# Patient Record
Sex: Female | Born: 1975 | Race: Asian | Hispanic: No | Marital: Married | Smoking: Never smoker
Health system: Southern US, Community
[De-identification: ages and names within clinical notes are randomized; demographics above are authoritative.]

## PROBLEM LIST (undated history)

## (undated) DIAGNOSIS — K219 Gastro-esophageal reflux disease without esophagitis: Secondary | ICD-10-CM

## (undated) HISTORY — PX: TONSILECTOMY, ADENOIDECTOMY, BILATERAL MYRINGOTOMY AND TUBES: SHX2538

## (undated) HISTORY — PX: RHINOPLASTY: SUR1284

## (undated) HISTORY — DX: Gastro-esophageal reflux disease without esophagitis: K21.9

---

## 2014-04-01 HISTORY — PX: BREAST BIOPSY: SHX20

## 2015-07-13 ENCOUNTER — Ambulatory Visit: Payer: Self-pay | Admitting: Family Medicine

## 2015-07-27 ENCOUNTER — Ambulatory Visit (INDEPENDENT_AMBULATORY_CARE_PROVIDER_SITE_OTHER): Payer: BLUE CROSS/BLUE SHIELD | Admitting: Family Medicine

## 2015-07-27 ENCOUNTER — Encounter: Payer: Self-pay | Admitting: Family Medicine

## 2015-07-27 VITALS — BP 100/60 | HR 81 | Temp 98.3°F | Ht 62.99 in | Wt 108.0 lb

## 2015-07-27 DIAGNOSIS — Z9289 Personal history of other medical treatment: Secondary | ICD-10-CM

## 2015-07-27 DIAGNOSIS — J029 Acute pharyngitis, unspecified: Secondary | ICD-10-CM | POA: Insufficient documentation

## 2015-07-27 DIAGNOSIS — A530 Latent syphilis, unspecified as early or late: Secondary | ICD-10-CM | POA: Insufficient documentation

## 2015-07-27 LAB — COMPLETE METABOLIC PANEL WITH GFR
ALT: 14 U/L (ref 6–29)
AST: 15 U/L (ref 10–30)
Albumin: 4.3 g/dL (ref 3.6–5.1)
Alkaline Phosphatase: 45 U/L (ref 33–115)
BILIRUBIN TOTAL: 0.7 mg/dL (ref 0.2–1.2)
BUN: 10 mg/dL (ref 7–25)
CO2: 27 mmol/L (ref 20–31)
CREATININE: 0.71 mg/dL (ref 0.50–1.10)
Calcium: 8.9 mg/dL (ref 8.6–10.2)
Chloride: 102 mmol/L (ref 98–110)
GFR, Est Non African American: >89 mL/min (ref >=60)
GLUCOSE: 87 mg/dL (ref 65–99)
Potassium: 4 mmol/L (ref 3.5–5.3)
SODIUM: 139 mmol/L (ref 135–146)
TOTAL PROTEIN: 6.9 g/dL (ref 6.1–8.1)

## 2015-07-27 LAB — CBC WITH DIFFERENTIAL/PLATELET
BASOS ABS: 0 {cells}/uL (ref 0–200)
Basophils Relative: 0 %
Eosinophils Absolute: 220 cells/uL (ref 15–500)
Eosinophils Relative: 4 %
HEMATOCRIT: 40.5 % (ref 35.0–45.0)
Hemoglobin: 13.9 g/dL (ref 11.7–15.5)
LYMPHS PCT: 29 %
Lymphs Abs: 1595 cells/uL (ref 850–3900)
MCH: 32 pg (ref 27.0–33.0)
MCHC: 34.3 g/dL (ref 32.0–36.0)
MCV: 93.1 fL (ref 80.0–100.0)
MONO ABS: 385 {cells}/uL (ref 200–950)
MPV: 10.7 fL (ref 7.5–12.5)
Monocytes Relative: 7 %
NEUTROS PCT: 60 %
Neutro Abs: 3300 cells/uL (ref 1500–7800)
Platelets: 245 10*3/uL (ref 140–400)
RBC: 4.35 MIL/uL (ref 3.80–5.10)
RDW: 12.5 % (ref 11.0–15.0)
WBC: 5.5 10*3/uL (ref 3.8–10.8)

## 2015-07-27 LAB — LIPID PANEL
Cholesterol: 190 mg/dL (ref 125–200)
HDL: 60 mg/dL (ref >=46)
LDL CALC: 115 mg/dL (ref <130)
TRIGLYCERIDES: 75 mg/dL (ref <150)
Total CHOL/HDL Ratio: 3.2 Ratio (ref <=5.0)
VLDL: 15 mg/dL (ref <30)

## 2015-07-27 LAB — TSH: TSH: 1.02 mIU/L

## 2015-07-27 NOTE — Progress Notes (Signed)
   Subjective:    Patient ID: Diamond Ramirez, female    DOB: 1975-12-22, 40 y.o.   MRN: [id number]  HPI 40 y/o [place] female presents for new patient visit. Video Interpretor present.  Reviewed New Patient Health History form and updated EPIC where appropriate. Reviewed PMH/PSH/Allergies/Med/Social History.  Reviewed vaccine record. Updated in EPIC.   Acute Concerns: 1. Sore Throat: ?tonsillitis in [place], pain worse with fried foods, saw ENT in [place] - told has issue with tonsils, told she needs them removed, currently has sore throat, has been treated with antibiotics for sore throat/?tonsillitis, no fevers, some cough, mild phlegm, no nasal congestion, no runny nose  2. History positive syphilis antibody - told while in [place] (treated with antibiotic in PCN in [place], documented history of PCN, see scanned document).   Social - no current health insurance, currently sexually active with husband (not interested in birth control at this time).    Review of Systems  Constitutional: Negative for fever and chills.  HENT: Positive for sore throat. Negative for postnasal drip and rhinorrhea.   Respiratory: Negative for cough and shortness of breath.   Cardiovascular: Negative for chest pain.  Gastrointestinal: Negative for nausea, vomiting and diarrhea.       Objective:   Physical Exam BP 100/60 mmHg  Pulse 81  Temp(Src) 98.3 F (36.8 C) (Oral)  Ht 5' 2.99" (1.6 m)  Wt 108 lb (48.988 kg)  BMI 19.14 kg/m2  SpO2 98%  LMP 07/26/2015  Gen: pleasant [place] female, NAD HEENT: normocephalic, PERRL, EOMI, no scleral icterus, bilateral TM's pearly grey, nasal septum midline, no rhinorrhea, MMM, uvula midline, no pharyngeal erythema or exudate, neck supple, no thyromegaly, no cervical adenopathy Cardiac: RRR, S1 and S2 present, no murmur, no heaves/thrills Resp: CTAB, normal effort Abd: soft, no tenderness, normal bowel sounds Ext: no edema, 2+ radial and DP  pulses       Assessment & Plan:  Sore throat Chronic sore throat. Exam unremarkable today. Previously told by ENT in [place] that she may need tonsils taken out. 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 -check RPR and HIV  Will also check basic labs today: CBC/CMP/Lipids/TSH

## 2015-07-27 NOTE — Patient Instructions (Addendum)
It was very nice to meet you.   Dr Randolm IdolFletke will call you with you lab results.   Chronic sore throat/?tonsillitis - you have referred to a ear,nose, and throat doctor. My office will call to schedule an appointment.   Please schedule a follow up appointment for you pap smear/well women exam.

## 2015-07-28 LAB — RPR TITER

## 2015-07-28 LAB — RPR: RPR Ser Ql: REACTIVE — AB

## 2015-07-28 LAB — HIV ANTIBODY (ROUTINE TESTING W REFLEX): HIV: NONREACTIVE

## 2015-07-28 NOTE — Assessment & Plan Note (Signed)
Chronic sore throat. Exam unremarkable today. Previously told by ENT in [place] that she may need tonsils taken out. -referral placed to ENT

## 2015-07-28 NOTE — Assessment & Plan Note (Signed)
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 Treated in [place]. -check RPR and HIV

## 2015-07-31 LAB — FLUORESCENT TREPONEMAL AB(FTA)-IGG-BLD: FLUORESCENT TREPONEMAL ABS: REACTIVE — AB

## 2015-08-01 ENCOUNTER — Encounter: Payer: Self-pay | Admitting: Family Medicine

## 2015-08-16 DIAGNOSIS — K219 Gastro-esophageal reflux disease without esophagitis: Secondary | ICD-10-CM | POA: Diagnosis not present

## 2015-08-16 DIAGNOSIS — J312 Chronic pharyngitis: Secondary | ICD-10-CM | POA: Diagnosis not present

## 2015-08-21 ENCOUNTER — Other Ambulatory Visit: Payer: Self-pay | Admitting: Family Medicine

## 2015-08-21 MED ORDER — OMEPRAZOLE 40 MG PO CPDR: 40.0000 mg | DELAYED_RELEASE_CAPSULE | Freq: Two times a day (BID) | ORAL | Status: DC

## 2015-08-21 NOTE — Progress Notes (Signed)
Started on Omeprazole 40 mg BID by ENT. Added to medication list.

## 2015-08-31 ENCOUNTER — Other Ambulatory Visit (HOSPITAL_COMMUNITY)
Admission: RE | Admit: 2015-08-31 | Discharge: 2015-08-31 | Disposition: A | Discharge status: Home / Self Care | Payer: BLUE CROSS/BLUE SHIELD | Source: Ambulatory Visit | Attending: Family Medicine | Admitting: Family Medicine

## 2015-08-31 ENCOUNTER — Ambulatory Visit (INDEPENDENT_AMBULATORY_CARE_PROVIDER_SITE_OTHER): Payer: BLUE CROSS/BLUE SHIELD | Admitting: Family Medicine

## 2015-08-31 ENCOUNTER — Encounter: Payer: Self-pay | Admitting: Family Medicine

## 2015-08-31 VITALS — BP 105/60 | HR 69 | Temp 98.3°F | Ht 62.0 in | Wt 114.0 lb

## 2015-08-31 DIAGNOSIS — N63 Unspecified lump in breast: Secondary | ICD-10-CM

## 2015-08-31 DIAGNOSIS — Z1151 Encounter for screening for human papillomavirus (HPV): Secondary | ICD-10-CM | POA: Insufficient documentation

## 2015-08-31 DIAGNOSIS — Z01419 Encounter for gynecological examination (general) (routine) without abnormal findings: Secondary | ICD-10-CM | POA: Diagnosis not present

## 2015-08-31 DIAGNOSIS — Z0000 Encounter for general adult medical examination without abnormal findings: Secondary | ICD-10-CM | POA: Diagnosis not present

## 2015-08-31 DIAGNOSIS — Z124 Encounter for screening for malignant neoplasm of cervix: Secondary | ICD-10-CM | POA: Diagnosis not present

## 2015-08-31 DIAGNOSIS — N6310 Unspecified lump in the right breast, unspecified quadrant: Secondary | ICD-10-CM | POA: Insufficient documentation

## 2015-08-31 NOTE — Patient Instructions (Signed)
It was nice to see you today.   You will be schedule for a mammogram of your right breast.  Dr. Randolm IdolFletke will call you with your Pap Smear results.   May follow up in one year for annual visit unless you have an acute issue.

## 2015-08-31 NOTE — Assessment & Plan Note (Signed)
Right mobile breast mass at 9 o'clock position -diagnostic mammogram ordered

## 2015-08-31 NOTE — Assessment & Plan Note (Signed)
40 y/o female presents for preventative visit. - up to date on immunizations - Pap smear performed today - Encouraged continued healthy diet and resular exercise - information on living will/poa provided

## 2015-08-31 NOTE — Progress Notes (Signed)
40 y.o. year old female presents for well woman/preventative visit and annual GYN examination.  Acute Concerns: None  Diet: Eats cooked meals at home but eats out occasionally. She says she does not eat much but does eat meat, vegetables, and fruits.   Exercise: She uses stationary bike and treadmill. She exercises at night every day if she gets home early enough. Started exercising regularly a month ago.   Sexual/Birth History: Sexually active with husband. Has two children. No complications with those births. One miscarriage. LMP was at the end of April.   Birth Control: None  POA/Living Will: None  Social:  Social History   Social History  . Marital Status: Married    Spouse Name: N/A  . Number of Children: N/A  . Years of Education: N/A   Social History Main Topics  . Smoking status: Never Smoker   . Smokeless tobacco: None  . Alcohol Use: No  . Drug Use: No  . Sexual Activity: Not Asked   Other Topics Concern  . None   Social History Narrative   Primary Language: [place]   Employment: [employer]   Education: [employer]   Married -    [phone] (vaginal deliveries, miscarriage was spontaneous)   Husbands name: Lucianne Lei lat truong   Immunization: Immunization History  Administered Date(s) Administered  . MMR 06/10/2014  . Tdap 06/10/2014  . Varicella 05/31/2010    Cancer Screening:  Pap Smear: (had pap smear 2015 in [place])   Mammogram: never performed Baker Janus Score of 0.4% for 5 year risk and 5.5% for lifetime risk)  Colonoscopy: Not a candidate  Dexa: not a candidate  Reviewed labs completed on 07/27/15.    Physical Exam: VITALS: Reviewed GEN: Pleasant female, NAD HEENT: Normocephalic, PERRL, EOMI, no scleral icterus, bilateral TM pearly grey, nasal septum midline, MMM, uvula midline, no anterior or posterior lymphadenopathy, no thyromegaly CARDIAC:RRR, S1 and S2 present, no murmur, no heaves/thrills RESP: CTAB, normal effort BREAST:Exam  performed in the presence of a chaperone. Right breast mass - mobile at 9 o'clock position. No nipple discharge. No axillary lymphadenopathy. ABD: soft, no tenderness, normal bowel sounds GU/GYN:Exam performed in the presence of a chaperone. Normal external genitalia. Cervix unremarkable. Bimanual exam identified no masses EXT: No edema, 2+ radial and DP pulses SKIN: no rash  ASSESSMENT & PLAN: 39 y.o. female presents for annual well woman/preventative exam and GYN exam. Please see problem specific assessment and plan.   Preventative health care 40 y/o female presents for preventative visit. - up to date on immunizations - Pap smear performed today - Encouraged continued healthy diet and resular exercise - information on living will/poa provided  Breast mass, right Right mobile breast mass at 9 o'clock position -diagnostic mammogram ordered

## 2015-09-01 ENCOUNTER — Encounter: Payer: Self-pay | Admitting: Family Medicine

## 2015-09-01 LAB — CYTOLOGY - PAP

## 2015-09-04 ENCOUNTER — Ambulatory Visit
Admission: RE | Admit: 2015-09-04 | Discharge: 2015-09-04 | Disposition: A | Discharge status: Home / Self Care | Payer: BLUE CROSS/BLUE SHIELD | Source: Ambulatory Visit | Attending: Family Medicine | Admitting: Family Medicine

## 2015-09-04 DIAGNOSIS — N63 Unspecified lump in breast: Secondary | ICD-10-CM | POA: Diagnosis not present

## 2015-09-04 DIAGNOSIS — N6310 Unspecified lump in the right breast, unspecified quadrant: Secondary | ICD-10-CM

## 2015-09-04 DIAGNOSIS — R922 Inconclusive mammogram: Secondary | ICD-10-CM | POA: Diagnosis not present

## 2015-09-14 DIAGNOSIS — J312 Chronic pharyngitis: Secondary | ICD-10-CM | POA: Diagnosis not present

## 2015-09-14 DIAGNOSIS — K219 Gastro-esophageal reflux disease without esophagitis: Secondary | ICD-10-CM | POA: Diagnosis not present

## 2015-11-14 DIAGNOSIS — R070 Pain in throat: Secondary | ICD-10-CM | POA: Diagnosis not present

## 2015-11-14 DIAGNOSIS — K115 Sialolithiasis: Secondary | ICD-10-CM | POA: Diagnosis not present

## 2015-11-14 DIAGNOSIS — F458 Other somatoform disorders: Secondary | ICD-10-CM | POA: Diagnosis not present

## 2015-11-14 DIAGNOSIS — K219 Gastro-esophageal reflux disease without esophagitis: Secondary | ICD-10-CM | POA: Diagnosis not present

## 2015-11-23 DIAGNOSIS — R1310 Dysphagia, unspecified: Secondary | ICD-10-CM | POA: Diagnosis not present

## 2015-11-23 DIAGNOSIS — K219 Gastro-esophageal reflux disease without esophagitis: Secondary | ICD-10-CM | POA: Diagnosis not present

## 2015-11-23 DIAGNOSIS — J312 Chronic pharyngitis: Secondary | ICD-10-CM | POA: Diagnosis not present

## 2015-11-28 DIAGNOSIS — K219 Gastro-esophageal reflux disease without esophagitis: Secondary | ICD-10-CM | POA: Diagnosis not present

## 2015-11-28 DIAGNOSIS — J312 Chronic pharyngitis: Secondary | ICD-10-CM | POA: Diagnosis not present

## 2015-11-28 DIAGNOSIS — R1319 Other dysphagia: Secondary | ICD-10-CM | POA: Diagnosis not present

## 2015-11-28 DIAGNOSIS — K229 Disease of esophagus, unspecified: Secondary | ICD-10-CM | POA: Diagnosis not present

## 2015-11-28 DIAGNOSIS — Z0000 Encounter for general adult medical examination without abnormal findings: Secondary | ICD-10-CM | POA: Diagnosis not present

## 2015-11-28 DIAGNOSIS — R1310 Dysphagia, unspecified: Secondary | ICD-10-CM | POA: Diagnosis not present

## 2015-11-29 DIAGNOSIS — D487 Neoplasm of uncertain behavior of other specified sites: Secondary | ICD-10-CM | POA: Diagnosis not present

## 2015-11-29 DIAGNOSIS — C300 Malignant neoplasm of nasal cavity: Secondary | ICD-10-CM | POA: Diagnosis not present

## 2015-11-29 DIAGNOSIS — J342 Deviated nasal septum: Secondary | ICD-10-CM | POA: Diagnosis not present

## 2015-11-29 DIAGNOSIS — J322 Chronic ethmoidal sinusitis: Secondary | ICD-10-CM | POA: Diagnosis not present

## 2015-11-29 DIAGNOSIS — R040 Epistaxis: Secondary | ICD-10-CM | POA: Diagnosis not present

## 2015-11-29 DIAGNOSIS — J330 Polyp of nasal cavity: Secondary | ICD-10-CM | POA: Diagnosis not present

## 2015-12-11 NOTE — Progress Notes (Signed)
   Subjective:    Patient ID: Diamond Ramirez Diamond Ramirez, female    DOB: 04/29/1975, 40 y.o.   MRN: [id number]  HPI 40 y/o female presents for follow up of right breat mass. Video Interpretor Present.   Right breast mass Diagnostic mammogram and US in June identified probable benign mass. No further issues, no changes in size/redness/discharge  Dizziness Present for 10 years, 3-4 times per day, associated vision changes/lightheadedness, lasts for minutes to hours, no headaches, no chest pain, no sob, no weakness/numbness, previously told that she has low BP in [place], worse when standing up or sitting up from laying down, worse in AM, previously on medication in [place], no recent falls, drinks 5-6 bottles of water per day  Weight loss 4 pounds since June, recent sinusitis, had some nasal swelling and nose bleeding that has resolved, mainly concerned for cancer, taking Augmentin BID (given 60 tablets for unclear reason), also has a history of GERD and taking 40 mg, reports not eating much since in the [place] (food does not taste good to her).     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   Gen: pleasant [place] female.  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  RECOMMENDATION: Right breast ultrasound in 6 months.     Assessment & Plan:  Breast mass, right Stable. No current issues. -Needs repeat US right breast in January 2018  Preventative health care Flu shot provided.   Dizziness Dizziness. Differential included orthostatic hypotension (BP low but orthostatics normal, clinical most consistent with this) vs cardiac (no other cardiac symptoms, exam unremarkable) vs Meniere's (does have ringing in ears but bilateral and only after dizziness starts, no associated hearing loss) vs labyrinthitis/vestibular neuritis (unlikey as has been present for years) vs autonomic dysfunction. Unlikely to be BPPV as not positional. Unlikely to be intracranial mass as neuro exam unremarkable.  -check CBC and BMP today to evaluate for anemia and electrolyte abnormalities -has upcoming ENT appointment, encouraged her to discuss with ENT physician  Sinusitis Recently treated by another physician for acute maxillary sinusitis. Symptoms resolved. Negative exam. -patient counseled to stop Augmentin (given 30 day supply of medications) -has scheduled follow up with ENT  Patient concerned about weight loss. Only 4 pounds lost from last visit. Suspect due to decreased PO intake (does not like take of American food). Low suspicion for malignancy at this time. Monitor weight.

## 2015-12-14 ENCOUNTER — Encounter: Payer: Self-pay | Admitting: Family Medicine

## 2015-12-14 ENCOUNTER — Ambulatory Visit (INDEPENDENT_AMBULATORY_CARE_PROVIDER_SITE_OTHER): Payer: BLUE CROSS/BLUE SHIELD | Admitting: Family Medicine

## 2015-12-14 DIAGNOSIS — Z23 Encounter for immunization: Secondary | ICD-10-CM | POA: Diagnosis not present

## 2015-12-14 DIAGNOSIS — J0100 Acute maxillary sinusitis, unspecified: Secondary | ICD-10-CM

## 2015-12-14 DIAGNOSIS — N63 Unspecified lump in breast: Secondary | ICD-10-CM | POA: Diagnosis not present

## 2015-12-14 DIAGNOSIS — Z0000 Encounter for general adult medical examination without abnormal findings: Secondary | ICD-10-CM

## 2015-12-14 DIAGNOSIS — R42 Dizziness and giddiness: Secondary | ICD-10-CM

## 2015-12-14 DIAGNOSIS — N6310 Unspecified lump in the right breast, unspecified quadrant: Secondary | ICD-10-CM

## 2015-12-14 LAB — COMPLETE METABOLIC PANEL WITH GFR
ALT: 25 U/L (ref 6–29)
AST: 20 U/L (ref 10–30)
Albumin: 4.4 g/dL (ref 3.6–5.1)
Alkaline Phosphatase: 46 U/L (ref 33–115)
BUN: 9 mg/dL (ref 7–25)
CALCIUM: 8.9 mg/dL (ref 8.6–10.2)
CHLORIDE: 105 mmol/L (ref 98–110)
CO2: 28 mmol/L (ref 20–31)
CREATININE: 0.66 mg/dL (ref 0.50–1.10)
Glucose, Bld: 79 mg/dL (ref 65–99)
Potassium: 3.7 mmol/L (ref 3.5–5.3)
Sodium: 139 mmol/L (ref 135–146)
Total Bilirubin: 0.7 mg/dL (ref 0.2–1.2)
Total Protein: 6.8 g/dL (ref 6.1–8.1)

## 2015-12-14 LAB — CBC WITH DIFFERENTIAL/PLATELET
Basophils Absolute: 0 {cells}/uL (ref 0–200)
Basophils Relative: 0 %
Eosinophils Absolute: 250 {cells}/uL (ref 15–500)
Eosinophils Relative: 5 %
HCT: 39.4 % (ref 35.0–45.0)
Hemoglobin: 13.4 g/dL (ref 11.7–15.5)
Lymphocytes Relative: 38 %
Lymphs Abs: 1900 {cells}/uL (ref 850–3900)
MCH: 31 pg (ref 27.0–33.0)
MCHC: 34 g/dL (ref 32.0–36.0)
MCV: 91.2 fL (ref 80.0–100.0)
MPV: 10.6 fL (ref 7.5–12.5)
Monocytes Absolute: 450 {cells}/uL (ref 200–950)
Monocytes Relative: 9 %
Neutro Abs: 2400 {cells}/uL (ref 1500–7800)
Neutrophils Relative %: 48 %
Platelets: 251 K/uL (ref 140–400)
RBC: 4.32 MIL/uL (ref 3.80–5.10)
RDW: 12.3 % (ref 11.0–15.0)
WBC: 5 K/uL (ref 3.8–10.8)

## 2015-12-14 NOTE — Patient Instructions (Addendum)
It was a pleasure seeing you in clinic today. We discussed your episodes of dizziness, weight loss, and breast mass.  1. Dizziness -likely due to orthostatic hypotension, however some of your symptoms are not typical for this diagnosis (ear ringing and lasting up to an hour) -bring up your dizziness problems to your Ear, Nose and Throat doctor at the appointment on 01/09/2016 -continue to stay well-hydrated (you can drink one 20-ounce bottle of Gatorade per day) -take your time when changing from sitting to standing and make sure you are okay before walking to prevent falls -return to clinic if you experience severe headache, muscle weakness, chest pain, or other concerning symptoms -we will check your labs/blood today  2. Breast mass -mammogram done on 09/04/15 demonstrated that the R breast mass is likely benign -schedule follow-up mammogram at 6 months (01/18)  3. Throat pain -discuss with ear, nose and throat doctor. You recently had infection and inflammation of the nose and throat, which may be contributing. -we do not believe this is due to a malignant mass given your age, minimal weight loss, and lack of other constitutional findings ______________________________________________________________________________________  R?t vui ???c g?p b?n t?i phng khm ngy hm nay. Chng ti ? th?o lu?n v? cc giai ?o?n chng m?t, gi?m cn v kh?i u c?a b?n.  1. Chng m?t c th? do t?t huy?t p t? th? ??ng, tuy nhin m?t s? tri?u ch?ng c?a b?n khng ph?i l ?i?n hnh cho ch?n ?on ny (ti?ng chung tai v ko di ??n m?t gi?) -xo cc v?n ?? v? chng m?t cho bc s? Tai M?i, H?ng t?i bu?i h?n ngy 01/09/2016 - Ti?p t?c duy tr ?? ?m t?t (b?n c th? u?ng m?t chai Gatorade 20-ounce m?i ngy) - dnh th?i gian c?a b?n khi thay ??i t? ng?i sang ??ng v ??m b?o r?ng b?n v?n ?n tr??c khi ?i b? ?? ng?n ng?a t ng - tr? l?i phng khm n?u b?n b? ?au ??u n?ng, y?u c?, ?au ng?c, ho?c cc tri?u ch?ng lin  quan khc -Chng ti s? ki?m tra phng th nghi?m / mu c?a b?n ngy hm nay  2. Kh?i l??ng v -mambogram ???c th?c hi?n vo ngy 09/04/15 ch?ng minh r?ng kh?i u v R c th? lnh tnh -m?ch v k? ti?p theo chu k? ? 6 thng (01/18)  3. ?au c? h?ng th?o lu?n v?i bc s? v? tai, m?i v h?ng. G?n ?y b?n ? b? nhi?m trng v vim m?i v c? h?ng, c th? gp ph?n. chng ti khng tin r?ng ?y l do kh?i u c tnh cho tu?i c?a b?n, gi?m cn t?i [place]?u, v [place]yn?u cc pht hi?n hi?n php khc

## 2015-12-15 ENCOUNTER — Encounter: Payer: Self-pay | Admitting: Family Medicine

## 2015-12-15 DIAGNOSIS — J329 Chronic sinusitis, unspecified: Secondary | ICD-10-CM | POA: Insufficient documentation

## 2015-12-15 NOTE — Assessment & Plan Note (Signed)
Flu shot provided. 

## 2015-12-15 NOTE — Assessment & Plan Note (Signed)
Stable. No current issues. -Needs repeat US right breast in January 2018

## 2015-12-15 NOTE — Assessment & Plan Note (Signed)
Dizziness. Differential included orthostatic hypotension (BP low but orthostatics normal, clinical most consistent with this) vs cardiac (no other cardiac symptoms, exam unremarkable) vs Meniere's (does have ringing in ears but bilateral and only after dizziness starts, no associated hearing loss) vs labyrinthitis/vestibular neuritis (unlikey as has been present for years) vs autonomic dysfunction. Unlikely to be BPPV as not positional. Unlikely to be intracranial mass as neuro exam unremarkable.  -check CBC and BMP today to evaluate for anemia and electrolyte abnormalities -has upcoming ENT appointment, encouraged her to discuss with ENT physician

## 2015-12-15 NOTE — Assessment & Plan Note (Signed)
Recently treated by another physician for acute maxillary sinusitis. Symptoms resolved. Negative exam. -patient counseled to stop Augmentin (given 30 day supply of medications) -has scheduled follow up with ENT

## 2016-01-09 DIAGNOSIS — J322 Chronic ethmoidal sinusitis: Secondary | ICD-10-CM | POA: Diagnosis not present

## 2016-01-09 DIAGNOSIS — J330 Polyp of nasal cavity: Secondary | ICD-10-CM | POA: Diagnosis not present

## 2016-02-05 ENCOUNTER — Other Ambulatory Visit: Payer: Self-pay | Admitting: Family Medicine

## 2016-02-08 ENCOUNTER — Other Ambulatory Visit: Payer: Self-pay | Admitting: Family Medicine

## 2016-02-08 DIAGNOSIS — N6310 Unspecified lump in the right breast, unspecified quadrant: Secondary | ICD-10-CM

## 2016-02-20 DIAGNOSIS — J322 Chronic ethmoidal sinusitis: Secondary | ICD-10-CM | POA: Diagnosis not present

## 2016-02-20 DIAGNOSIS — J329 Chronic sinusitis, unspecified: Secondary | ICD-10-CM | POA: Diagnosis not present

## 2016-02-20 DIAGNOSIS — J338 Other polyp of sinus: Secondary | ICD-10-CM | POA: Diagnosis not present

## 2016-02-20 DIAGNOSIS — D487 Neoplasm of uncertain behavior of other specified sites: Secondary | ICD-10-CM | POA: Diagnosis not present

## 2016-03-05 ENCOUNTER — Ambulatory Visit
Admission: RE | Admit: 2016-03-05 | Discharge: 2016-03-05 | Disposition: A | Discharge status: Home / Self Care | Payer: BLUE CROSS/BLUE SHIELD | Source: Ambulatory Visit | Attending: Family Medicine | Admitting: Family Medicine

## 2016-03-05 DIAGNOSIS — N6310 Unspecified lump in the right breast, unspecified quadrant: Secondary | ICD-10-CM

## 2016-06-02 ENCOUNTER — Ambulatory Visit (HOSPITAL_COMMUNITY)
Admission: EM | Admit: 2016-06-02 | Discharge: 2016-06-02 | Disposition: A | Discharge status: Home / Self Care | Payer: BLUE CROSS/BLUE SHIELD | Attending: Family Medicine | Admitting: Family Medicine

## 2016-06-02 ENCOUNTER — Encounter (HOSPITAL_COMMUNITY): Payer: Self-pay | Admitting: Emergency Medicine

## 2016-06-02 DIAGNOSIS — R0982 Postnasal drip: Secondary | ICD-10-CM

## 2016-06-02 DIAGNOSIS — J029 Acute pharyngitis, unspecified: Secondary | ICD-10-CM | POA: Diagnosis not present

## 2016-06-02 LAB — POCT RAPID STREP A: Streptococcus, Group A Screen (Direct): NEGATIVE

## 2016-06-02 NOTE — ED Provider Notes (Signed)
CSN: [id number]     Arrival date & time 06/02/16  1248 History   None    Chief Complaint  Patient presents with  . Sore Throat   (Consider location/radiation/quality/duration/timing/severity/associated sxs/prior Treatment) 41 year old female is accompanied by English-speaking interpreter stating that she has had a sore throat for 2 days. It is associated with a fever of 99. Denies perception of PND.      History reviewed. No pertinent past medical history. Past Surgical History:  Procedure Laterality Date  . RHINOPLASTY N/A    Family History  Problem Relation Age of Onset  . Hyperlipidemia Mother   . Hypertension Mother   . Diabetes Father   . Hyperlipidemia Father   . Hypertension Father    Social History  Substance Use Topics  . Smoking status: Never Smoker  . Smokeless tobacco: Never Used  . Alcohol use No   OB History    No data available     Review of Systems  Constitutional: Positive for fever.  HENT: Positive for sore throat. Negative for congestion, postnasal drip, rhinorrhea and tinnitus.   Respiratory: Negative.  Negative for cough and shortness of breath.   Cardiovascular: Negative for chest pain.  Gastrointestinal: Negative.   All other systems reviewed and are negative.   Allergies  Patient has no known allergies.  Home Medications   Prior to Admission medications   Medication Sig Start Date End Date Taking? Authorizing Provider  fluticasone (FLONASE) 50 MCG/ACT nasal spray SHAKE LQ AND U 2 SPRAYS IEN D 11/29/15   Historical Provider, MD  GINSENG KOREAN PO Take 1 tablet by mouth daily.    Historical Provider, MD  omeprazole (PRILOSEC) 40 MG capsule Take 1 capsule (40 mg total) by mouth 2 (two) times daily. 08/21/15   Uvaldo Rising, MD   Meds Ordered and Administered this Visit  Medications - No data to display  BP (!) 94/52 (BP Location: Right Arm)   Pulse 85   Temp 98.7 F (37.1 C) (Oral)   Resp 16   LMP 05/16/2016   SpO2 100%  No data  found.   Physical Exam  Constitutional: She appears well-developed and well-nourished. No distress.  HENT:  Head: Normocephalic and atraumatic.  Mouth/Throat: No oropharyngeal exudate.  Bilateral TMs are normal. Oropharynx with minor erythema and cobblestoning. Positive for mild amount of clear PND. No exudates.  Eyes: EOM are normal.  Neck: Normal range of motion. Neck supple.  Cardiovascular: Normal rate, regular rhythm and normal heart sounds.   Pulmonary/Chest: Effort normal and breath sounds normal. No respiratory distress. She has no wheezes.  Lymphadenopathy:    She has no cervical adenopathy.  Neurological: She is alert.  Skin: Skin is warm and dry.  Nursing note and vitals reviewed.   Urgent Care Course     Procedures (including critical care time)  Labs Review Labs Reviewed  POCT RAPID STREP A   Results for orders placed or performed during the hospital encounter of 06/02/16  POCT rapid strep A [facility])  Result Value Ref Range   Streptococcus, Group A Screen (Direct) NEGATIVE NEGATIVE    Imaging Review No results found.   Visual Acuity Review  Right Eye Distance:   Left Eye Distance:   Bilateral Distance:    Right Eye Near:   Left Eye Near:    Bilateral Near:         MDM   1. PND (post-nasal drip)   2. Sore throat    There is drainage  in the back of the throat causing a specific pattern to indicate this. Strep test is negative. A culture will be performed as a backup and if positive we will call and treat appropriately. For now the treatment is geared toward limiting the drainage with antihistamines. Recommend nondrowsy formula Allegra or Claritin. If you need something a little stronger you may use Chlor-Trimeton 2-4 mg every 4 hours. This may cause drowsiness. Drink plenty of cool liquids and stay well-hydrated. He may use Cepacol lozenges for sore throat pain and ibuprofen 400-600 mg every 6 hours for discomfort.    Hayden Rasmussenavid Faustina Gebert,  NP 06/02/16 1558

## 2016-06-02 NOTE — Discharge Instructions (Signed)
There is drainage in the back of the throat causing a specific pattern to indicate this. Strep test is negative. A culture will be performed as a backup and if positive we will call and treat appropriately. For now the treatment is geared toward limiting the drainage with antihistamines. Recommend nondrowsy formula Allegra or Claritin. If you need something a little stronger you may use Chlor-Trimeton 2-4 mg every 4 hours. This may cause drowsiness. Drink plenty of cool liquids and stay well-hydrated. He may use Cepacol lozenges for sore throat pain and ibuprofen 400-600 mg every 6 hours for discomfort.

## 2016-06-02 NOTE — ED Triage Notes (Signed)
Patient presents to [facility] today with a complaint of sore throat. Patient states that her symptoms began on Friday and she states that she has had a low-grade fever and chills. Patient denies Nausea and Vomiting.

## 2016-06-03 NOTE — Progress Notes (Signed)
   Subjective:    Patient ID: Diamond Ramirez, female    DOB: 01/20/1976, 41 y.o.   MRN: [id number]  HPI 41 y/o female presents for routine follow up. History obtained with help of [place] interpretor.   Sore Throat/Sinusitis Follows [facility]Currently taking Flonase.  Seen in ED on 3/4 for acute sore throat. Rapid Strep was negative. However strep culture positive.    Patient reports acute sore throat (increased from normal) over the past 5 days, swollen glands in neck, fevers at start of symptoms, some associated cough and rhinorrhea, taking Claritin and Ibuprofen which are not helping symptoms.   Hx. Syphilis Previously treated, patient would like to know if she needs to repeat RPR  HM Up to date  Social Nonsmoker  Review of Systems See above    Objective:   Physical Exam BP 108/62   Pulse 98   Temp 99 F (37.2 C) (Oral)   Ht 5\' 2"  (1.575 m)   Wt 116 lb 12.8 oz (53 kg)   LMP 05/16/2016   SpO2 99%   BMI 21.36 kg/m   Gen: pleasant female, NAD HEENT: normocephalic, PERRL, EOMI, no scleral icterus, no rhinorrhea, MMM, uvula midline, normal tonsils, no exudate, no pharyngeal erythema, neck supple, bilateral anterior cervical adenopathy present Cardiac: RRR, S1 and S2 present, no murmur Resp: CTAB, normal effort      Assessment & Plan:  Sore throat Acute symptoms most consistent with viral etiology however she did have a +strep culture in recent ED visit and has bilateral cervical lymphadenopathy. May represent chronic colonization with strep.  -will treat with Amoxicillin 500 mg BID for 10 days -encouraged patient to follow up with ENT as she wishes to have tonsils removed.   Positive RPR test Reassured patient that she is adequately treated for Syphilis. Last RPR titer (after treatment) was 1:2. We also discussed that Syphilis is most commonly transmitted by sexual activity. She states that her husband tested negative (this is her  only ever sex partner). Counseled her that I could not be sure where she contracted Syphilis and perhaps was a false + test. No further testing warranted at this time.

## 2016-06-04 LAB — CULTURE, GROUP A STREP (THRC)

## 2016-06-06 ENCOUNTER — Encounter: Payer: Self-pay | Admitting: Family Medicine

## 2016-06-06 ENCOUNTER — Ambulatory Visit (INDEPENDENT_AMBULATORY_CARE_PROVIDER_SITE_OTHER): Payer: BLUE CROSS/BLUE SHIELD | Admitting: Family Medicine

## 2016-06-06 VITALS — BP 108/62 | HR 98 | Temp 99.0°F | Ht 62.0 in | Wt 116.8 lb

## 2016-06-06 DIAGNOSIS — J029 Acute pharyngitis, unspecified: Secondary | ICD-10-CM | POA: Diagnosis not present

## 2016-06-06 DIAGNOSIS — A530 Latent syphilis, unspecified as early or late: Secondary | ICD-10-CM | POA: Diagnosis not present

## 2016-06-06 DIAGNOSIS — J020 Streptococcal pharyngitis: Secondary | ICD-10-CM

## 2016-06-06 LAB — POCT RAPID STREP A (OFFICE): Rapid Strep A Screen: NEGATIVE

## 2016-06-06 MED ORDER — AMOXICILLIN 500 MG PO CAPS: 500.0000 mg | ORAL_CAPSULE | Freq: Two times a day (BID) | ORAL | 0 refills | Status: DC

## 2016-06-06 NOTE — Assessment & Plan Note (Signed)
Acute symptoms most consistent with viral etiology however she did have a +strep culture in recent ED visit and has bilateral cervical lymphadenopathy. May represent chronic colonization with strep.  -will treat with Amoxicillin 500 mg BID for 10 days -encouraged patient to follow up with ENT as she wishes to have tonsils removed.

## 2016-06-06 NOTE — Patient Instructions (Signed)
It was nice to see you.  Syphilis - you have been treated, there is no need to recheck at this time.  Sore Throat - start Amoxicillin 500 mg twice daily for 10 days, please return to your ENT doctor to discuss removal of your tonsils.

## 2016-06-06 NOTE — Assessment & Plan Note (Signed)
Reassured patient that she is adequately treated for Syphilis. Last RPR titer (after treatment) was 1:2. We also discussed that Syphilis is most commonly transmitted by sexual activity. She states that her husband tested negative (this is her only ever sex partner). Counseled her that I could not be sure where she contracted Syphilis and perhaps was a false + test. No further testing warranted at this time.

## 2016-06-18 DIAGNOSIS — J330 Polyp of nasal cavity: Secondary | ICD-10-CM | POA: Diagnosis not present

## 2016-06-18 DIAGNOSIS — J3501 Chronic tonsillitis: Secondary | ICD-10-CM | POA: Diagnosis not present

## 2016-07-17 ENCOUNTER — Other Ambulatory Visit: Payer: Self-pay | Admitting: Family Medicine

## 2016-07-17 DIAGNOSIS — N6310 Unspecified lump in the right breast, unspecified quadrant: Secondary | ICD-10-CM

## 2016-09-04 ENCOUNTER — Ambulatory Visit
Admission: RE | Admit: 2016-09-04 | Discharge: 2016-09-04 | Disposition: A | Discharge status: Home / Self Care | Payer: BLUE CROSS/BLUE SHIELD | Source: Ambulatory Visit | Attending: Family Medicine | Admitting: Family Medicine

## 2016-09-04 DIAGNOSIS — N6310 Unspecified lump in the right breast, unspecified quadrant: Secondary | ICD-10-CM

## 2016-09-04 DIAGNOSIS — N6313 Unspecified lump in the right breast, lower outer quadrant: Secondary | ICD-10-CM | POA: Diagnosis not present

## 2016-09-04 DIAGNOSIS — N6311 Unspecified lump in the right breast, upper outer quadrant: Secondary | ICD-10-CM | POA: Diagnosis not present

## 2016-09-04 DIAGNOSIS — R922 Inconclusive mammogram: Secondary | ICD-10-CM | POA: Diagnosis not present

## 2017-02-04 DIAGNOSIS — J322 Chronic ethmoidal sinusitis: Secondary | ICD-10-CM | POA: Diagnosis not present

## 2017-02-04 DIAGNOSIS — J338 Other polyp of sinus: Secondary | ICD-10-CM | POA: Diagnosis not present

## 2017-02-04 DIAGNOSIS — J358 Other chronic diseases of tonsils and adenoids: Secondary | ICD-10-CM | POA: Diagnosis not present

## 2017-02-04 DIAGNOSIS — J3501 Chronic tonsillitis: Secondary | ICD-10-CM | POA: Diagnosis not present

## 2017-02-04 DIAGNOSIS — J330 Polyp of nasal cavity: Secondary | ICD-10-CM | POA: Diagnosis not present

## 2017-02-17 DIAGNOSIS — J310 Chronic rhinitis: Secondary | ICD-10-CM | POA: Diagnosis not present

## 2017-02-17 DIAGNOSIS — J3501 Chronic tonsillitis: Secondary | ICD-10-CM | POA: Diagnosis not present

## 2017-02-17 DIAGNOSIS — J358 Other chronic diseases of tonsils and adenoids: Secondary | ICD-10-CM | POA: Diagnosis not present

## 2017-02-27 DIAGNOSIS — J358 Other chronic diseases of tonsils and adenoids: Secondary | ICD-10-CM | POA: Diagnosis not present

## 2017-02-27 DIAGNOSIS — J3501 Chronic tonsillitis: Secondary | ICD-10-CM | POA: Diagnosis not present

## 2017-07-29 ENCOUNTER — Other Ambulatory Visit: Payer: Self-pay | Admitting: Obstetrics and Gynecology

## 2017-07-29 DIAGNOSIS — N6310 Unspecified lump in the right breast, unspecified quadrant: Secondary | ICD-10-CM

## 2017-09-08 ENCOUNTER — Ambulatory Visit
Admission: RE | Admit: 2017-09-08 | Discharge: 2017-09-08 | Disposition: A | Discharge status: Home / Self Care | Payer: BLUE CROSS/BLUE SHIELD | Source: Ambulatory Visit | Attending: Obstetrics and Gynecology | Admitting: Obstetrics and Gynecology

## 2017-09-08 DIAGNOSIS — N6310 Unspecified lump in the right breast, unspecified quadrant: Secondary | ICD-10-CM | POA: Diagnosis not present

## 2017-09-08 DIAGNOSIS — R922 Inconclusive mammogram: Secondary | ICD-10-CM | POA: Diagnosis not present

## 2017-10-14 ENCOUNTER — Other Ambulatory Visit (HOSPITAL_COMMUNITY)
Admission: RE | Admit: 2017-10-14 | Discharge: 2017-10-14 | Disposition: A | Discharge status: Home / Self Care | Payer: BLUE CROSS/BLUE SHIELD | Source: Ambulatory Visit | Attending: Family Medicine | Admitting: Family Medicine

## 2017-10-14 ENCOUNTER — Ambulatory Visit (INDEPENDENT_AMBULATORY_CARE_PROVIDER_SITE_OTHER): Payer: BLUE CROSS/BLUE SHIELD | Admitting: Family Medicine

## 2017-10-14 VITALS — BP 105/70 | HR 80 | Temp 98.4°F | Wt 117.2 lb

## 2017-10-14 DIAGNOSIS — Z113 Encounter for screening for infections with a predominantly sexual mode of transmission: Secondary | ICD-10-CM | POA: Insufficient documentation

## 2017-10-14 DIAGNOSIS — Z13220 Encounter for screening for lipoid disorders: Secondary | ICD-10-CM | POA: Diagnosis not present

## 2017-10-14 DIAGNOSIS — Z0000 Encounter for general adult medical examination without abnormal findings: Secondary | ICD-10-CM

## 2017-10-14 NOTE — Progress Notes (Signed)
Date of Visit: 10/14/2017   HPI:  Patient presents today for a well woman exam.   Concerns today: wants syphilis testing and labwork Periods: monthly periods Contraception: coitus interruptus. Not interested in other forms of BC. Pelvic symptoms: no vaginal discharge or pelvic pain Sexual activity: female partner STD Screening: history of latent syphilis, previously treated in [place] with PCN injections x3 and would like retesting on labs today Pap smear status: current, has gynecologist Exercise: not often, walks when she has time Diet: eats well balanced diet Smoking: no Alcohol: no Drugs: no Mood: good in general Dentist: no dentist yet  ROS: See HPI  PMFSH:  Cancers in family: none FH - dad with diabetes   PHYSICAL EXAM: BP 105/70 (BP Location: Left Arm, Patient Position: Sitting, Cuff Size: Normal)   Pulse 80   Temp 98.4 F (36.9 C) (Oral)   Wt 117 lb 3.2 oz (53.2 kg)   SpO2 99%   BMI 21.44 kg/m  Gen: NAD, pleasant, cooperative HEENT: NCAT, PERRL, no palpable thyromegaly or anterior cervical lymphadenopathy Heart: RRR, no murmurs Lungs: CTAB, NWOB Abdomen: soft, nontender to palpation Neuro: grossly nonfocal, speech normal Extremities: 2+ patellar reflexes bilaterally, No appreciable lower extremity edema bilaterally   ASSESSMENT/PLAN:  Health maintenance:  -STD screening: check urine gc/chlamydia/trich, RPR, HIV test -pap smear: current -mammogram: current -lipid screening: check lipids & CMET today -immunizations: current -handout given on health maintenance topics  FOLLOW UP: Follow up in 1 year for next physical  GrenadaBrittany J. Pollie MeyerMcIntyre, MD [facility]

## 2017-10-14 NOTE — Patient Instructions (Signed)

## 2017-10-15 LAB — URINE CYTOLOGY ANCILLARY ONLY
Chlamydia: NEGATIVE
Neisseria Gonorrhea: NEGATIVE
Trichomonas: NEGATIVE

## 2017-10-16 LAB — RPR, QUANT+TP ABS (REFLEX): TREPONEMA PALLIDUM AB: POSITIVE — AB

## 2017-10-16 LAB — CMP14+EGFR
A/G RATIO: 1.9 (ref 1.2–2.2)
ALT: 15 IU/L (ref 0–32)
AST: 19 IU/L (ref 0–40)
Albumin: 4.4 g/dL (ref 3.5–5.5)
Alkaline Phosphatase: 52 IU/L (ref 39–117)
BILIRUBIN TOTAL: 0.5 mg/dL (ref 0.0–1.2)
BUN/Creatinine Ratio: 13 (ref 9–23)
BUN: 9 mg/dL (ref 6–24)
CALCIUM: 9 mg/dL (ref 8.7–10.2)
CHLORIDE: 104 mmol/L (ref 96–106)
CO2: 23 mmol/L (ref 20–29)
Creatinine, Ser: 0.68 mg/dL (ref 0.57–1.00)
GFR calc Af Amer: 124 mL/min/{1.73_m2} (ref >59)
GFR, EST NON AFRICAN AMERICAN: 107 mL/min/{1.73_m2} (ref >59)
Globulin, Total: 2.3 g/dL (ref 1.5–4.5)
Glucose: 85 mg/dL (ref 65–99)
POTASSIUM: 4.1 mmol/L (ref 3.5–5.2)
Sodium: 143 mmol/L (ref 134–144)
Total Protein: 6.7 g/dL (ref 6.0–8.5)

## 2017-10-16 LAB — LIPID PANEL
Chol/HDL Ratio: 4.1 ratio (ref 0.0–4.4)
Cholesterol, Total: 179 mg/dL (ref 100–199)
HDL: 44 mg/dL (ref >39)
LDL Calculated: 98 mg/dL (ref 0–99)
TRIGLYCERIDES: 184 mg/dL — AB (ref 0–149)
VLDL Cholesterol Cal: 37 mg/dL (ref 5–40)

## 2017-10-16 LAB — HIV ANTIBODY (ROUTINE TESTING W REFLEX): HIV SCREEN 4TH GENERATION: NONREACTIVE

## 2017-10-16 LAB — RPR: RPR: REACTIVE — AB

## 2017-10-24 ENCOUNTER — Encounter: Payer: Self-pay | Admitting: Family Medicine

## 2018-01-12 DIAGNOSIS — Z130 Encounter for screening for diseases of the blood and blood-forming organs and certain disorders involving the immune mechanism: Secondary | ICD-10-CM | POA: Diagnosis not present

## 2018-01-12 DIAGNOSIS — Z01419 Encounter for gynecological examination (general) (routine) without abnormal findings: Secondary | ICD-10-CM | POA: Diagnosis not present

## 2018-01-12 DIAGNOSIS — Z124 Encounter for screening for malignant neoplasm of cervix: Secondary | ICD-10-CM | POA: Diagnosis not present

## 2018-01-12 DIAGNOSIS — Z1389 Encounter for screening for other disorder: Secondary | ICD-10-CM | POA: Diagnosis not present

## 2018-12-27 DIAGNOSIS — Z23 Encounter for immunization: Secondary | ICD-10-CM | POA: Diagnosis not present

## 2019-03-10 ENCOUNTER — Ambulatory Visit: Payer: BC Managed Care – PPO | Admitting: Family Medicine

## 2019-03-10 ENCOUNTER — Encounter: Payer: Self-pay | Admitting: Family Medicine

## 2019-03-10 ENCOUNTER — Other Ambulatory Visit (HOSPITAL_COMMUNITY)
Admission: RE | Admit: 2019-03-10 | Discharge: 2019-03-10 | Disposition: A | Discharge status: Home / Self Care | Payer: BC Managed Care – PPO | Source: Ambulatory Visit | Attending: Family Medicine | Admitting: Family Medicine

## 2019-03-10 ENCOUNTER — Other Ambulatory Visit: Payer: Self-pay

## 2019-03-10 VITALS — BP 100/62 | HR 81 | Ht 62.0 in | Wt 119.1 lb

## 2019-03-10 DIAGNOSIS — A530 Latent syphilis, unspecified as early or late: Secondary | ICD-10-CM

## 2019-03-10 DIAGNOSIS — Z0000 Encounter for general adult medical examination without abnormal findings: Secondary | ICD-10-CM

## 2019-03-10 DIAGNOSIS — Z124 Encounter for screening for malignant neoplasm of cervix: Secondary | ICD-10-CM | POA: Diagnosis not present

## 2019-03-10 DIAGNOSIS — R1084 Generalized abdominal pain: Secondary | ICD-10-CM | POA: Diagnosis not present

## 2019-03-10 LAB — POCT URINALYSIS DIP (MANUAL ENTRY)
Bilirubin, UA: NEGATIVE
Blood, UA: NEGATIVE
Glucose, UA: NEGATIVE mg/dL
Ketones, POC UA: NEGATIVE mg/dL
Leukocytes, UA: NEGATIVE
Nitrite, UA: NEGATIVE
Protein Ur, POC: NEGATIVE mg/dL
Spec Grav, UA: 1.015 (ref 1.010–1.025)
Urobilinogen, UA: 0.2 E.U./dL
pH, UA: 7 (ref 5.0–8.0)

## 2019-03-10 NOTE — Progress Notes (Addendum)
*Marketing executive used, patient's daughter was also in the room at her request Subjective:  Diamond Ramirez is a 43 y.o. female who presents to the [facility] today with a chief complaint of right upper quadrant pain.   HPI: Generalized abdominal pain Patient complaining of intermittent right upper quadrant pain.  It gets up to a rating of 6 or 7 out of 10.  She is unsure of any particular aggravating or alleviating factor.  No fevers, no vomiting, no diarrhea or urinary symptoms.  No radiation of pain.  Cervical cancer screening Patient overdue for Pap screening, no concerning symptoms.  Positive RPR test Last RPR check was July 2019  No symptoms per patient  Objective:  Physical Exam: BP 100/62   Pulse 81   Ht 5\' 2"  (1.575 m)   Wt 119 lb 2 oz (54 kg)   LMP 02/21/2019   SpO2 98%   BMI 21.79 kg/m   Gen: NAD, resting comfortably CV: RRR with no murmurs appreciated Pulm: NWOB, CTAB with no crackles, wheezes, or rhonchi GI: Normal bowel sounds present. Soft, only mild tenderness to right upper quadrant, negative Murphy, Nondistended.  No skin changes. GU: *sensitive exam performed entirely with CMA Tashira in room*  Not pathologic lesions/rashes noted on visual exam, no discharge/bleeding noted on speculum exam, no lesions noted to cervix MSK: no edema, cyanosis, or clubbing noted Skin: warm, dry Neuro: grossly normal, moves all extremities Psych: Normal affect and thought content  ASVD risk calculated at 0.6% 1yr  Results for orders placed or performed in visit on 03/10/19 (from the past 72 hour(s))  POCT urinalysis dipstick     Status: None   Collection Time: 03/10/19  9:30 AM  Result Value Ref Range   Color, UA yellow yellow   Clarity, UA clear clear   Glucose, UA negative negative mg/dL   Bilirubin, UA negative negative   Ketones, POC UA negative negative mg/dL   Spec Grav, UA 1.015 1.010 - 1.025   Blood, UA negative negative   pH, UA 7.0 5.0 - 8.0   Protein Ur,  POC negative negative mg/dL   Urobilinogen, UA 0.2 0.2 or 1.0 E.U./dL   Nitrite, UA Negative Negative   Leukocytes, UA Negative Negative  RPR     Status: Abnormal (Preliminary result)   Collection Time: 03/10/19  2:12 PM  Result Value Ref Range   RPR Ser Ql Reactive (A) Non Reactive  HIV antibody (with reflex)     Status: None   Collection Time: 03/10/19  2:12 PM  Result Value Ref Range   HIV Screen 4th Generation wRfx Non Reactive Non Reactive  RPR, quant & T.pallidum antibodies     Status: Abnormal (Preliminary result)   Collection Time: 03/10/19  2:12 PM  Result Value Ref Range   Rapid Plasma Reagin, Quant 1:2 (H) NonRea<1:1   T Pallidum Abs WILL FOLLOW      Assessment/Plan:  Generalized abdominal pain Patient complaining of intermittent right upper quadrant pain.  It gets up to a rating of 6 or 7 out of 10.  She is unsure of any particular aggravating or alleviating factor.  No fevers, no vomiting, no diarrhea or urinary symptoms.  No radiation of pain.  Mild pain only with deep palpation to right upper quadrant, no Murphy sign.  Suspect potential gallbladder colic.  Will order right upper quadrant ultrasound on an outpatient basis.  Emergency precautions given  Cervical cancer screening Patient overdue for Pap screening, no concerning symptoms.  Patient consents to Pap and cotest which is performed with CMA in the room, results pending at time of note.  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  No symptoms per patient  No change in plan at this time, can continue to recheck annually until RPR is negative     Marthenia Rolling, DO FAMILY MEDICINE RESIDENT - PGY3 03/11/2019 3:17 PM

## 2019-03-10 NOTE — Patient Instructions (Signed)
It was a pleasure to see you today! Thank you for choosing [facility] for your primary care. Gwinner was seen for abdominal pain. Come back to the clinic if there is anything that we can do for you.  Today we talked by abdominal pain, we ordered a number of test to check for potential causes in your stomach, gallbladder, pancreas.  We are also ordering a ultrasound over [facility] imaging to look at your gallbladder.  We will call you and let you know the results of that.  In the meantime you can use over-the-counter Tylenol or Tums for discomfort.  If anything gets significantly worse you can call us or go to the emergency department.   Please bring all your medications to every doctors visit   Sign up for My Chart to have easy access to your labs results, and communication with your Primary care physician.     Please check-out at the front desk before leaving the clinic.     Best,  Dr. Sherene Sires FAMILY MEDICINE RESIDENT - PGY3 03/10/2019 9:50 AM

## 2019-03-11 DIAGNOSIS — R1084 Generalized abdominal pain: Secondary | ICD-10-CM | POA: Insufficient documentation

## 2019-03-11 DIAGNOSIS — Z124 Encounter for screening for malignant neoplasm of cervix: Secondary | ICD-10-CM | POA: Insufficient documentation

## 2019-03-11 NOTE — Assessment & Plan Note (Signed)
Last RPR check was July 2019, recheck shows titer still at 1-2.  No symptoms per patient  No change in plan at this time, can continue to recheck annually until RPR is negative

## 2019-03-11 NOTE — Assessment & Plan Note (Signed)
Patient overdue for Pap screening, no concerning symptoms.  Patient consents to Pap and cotest which is performed with CMA in the room, results pending at time of note.  No pathology on physical exam

## 2019-03-11 NOTE — Assessment & Plan Note (Addendum)
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  HIV nonreactive

## 2019-03-11 NOTE — Assessment & Plan Note (Addendum)
Patient complaining of intermittent right upper quadrant pain.  It gets up to a rating of 6 or 7 out of 10.  She is unsure of any particular aggravating or alleviating factor.  No fevers, no vomiting, no diarrhea or urinary symptoms.  No radiation of pain.  Mild pain only with deep palpation to right upper quadrant, no Murphy sign.  Suspect potential gallbladder colic.  We will also order urea breath test.  Will order right upper quadrant ultrasound on an outpatient basis.  Emergency precautions given

## 2019-03-12 ENCOUNTER — Ambulatory Visit
Admission: RE | Admit: 2019-03-12 | Discharge: 2019-03-12 | Disposition: A | Discharge status: Home / Self Care | Payer: BC Managed Care – PPO | Source: Ambulatory Visit | Attending: Family Medicine | Admitting: Family Medicine

## 2019-03-12 DIAGNOSIS — K8020 Calculus of gallbladder without cholecystitis without obstruction: Secondary | ICD-10-CM | POA: Diagnosis not present

## 2019-03-12 DIAGNOSIS — R1084 Generalized abdominal pain: Secondary | ICD-10-CM

## 2019-03-12 LAB — RPR, QUANT+TP ABS (REFLEX)
Rapid Plasma Reagin, Quant: 1:2 {titer} — ABNORMAL HIGH
T Pallidum Abs: REACTIVE — AB

## 2019-03-12 LAB — HIV ANTIBODY (ROUTINE TESTING W REFLEX): HIV Screen 4th Generation wRfx: NONREACTIVE

## 2019-03-12 LAB — RPR: RPR Ser Ql: REACTIVE — AB

## 2019-03-12 LAB — H. PYLORI BREATH TEST: H pylori Breath Test: NEGATIVE

## 2019-03-15 LAB — CYTOLOGY - PAP
Comment: NEGATIVE
Diagnosis: NEGATIVE
High risk HPV: NEGATIVE

## 2019-04-11 ENCOUNTER — Ambulatory Visit (HOSPITAL_COMMUNITY)
Admission: EM | Admit: 2019-04-11 | Discharge: 2019-04-11 | Disposition: A | Discharge status: Home / Self Care | Payer: BC Managed Care – PPO | Attending: Emergency Medicine | Admitting: Emergency Medicine

## 2019-04-11 ENCOUNTER — Encounter (HOSPITAL_COMMUNITY): Payer: Self-pay

## 2019-04-11 ENCOUNTER — Other Ambulatory Visit: Payer: Self-pay

## 2019-04-11 DIAGNOSIS — H1031 Unspecified acute conjunctivitis, right eye: Secondary | ICD-10-CM | POA: Diagnosis not present

## 2019-04-11 MED ORDER — OLOPATADINE HCL 0.1 % OP SOLN: 1.0000 [drp] | Freq: Two times a day (BID) | OPHTHALMIC | 0 refills | Status: DC

## 2019-04-11 MED ORDER — POLYMYXIN B-TRIMETHOPRIM 10000-0.1 UNIT/ML-% OP SOLN: 1.0000 [drp] | Freq: Four times a day (QID) | OPHTHALMIC | 0 refills | Status: AC

## 2019-04-11 MED ORDER — FLUORESCEIN SODIUM 1 MG OP STRP
ORAL_STRIP | OPHTHALMIC | Status: AC
  Filled 2019-04-11: qty 2

## 2019-04-11 MED ORDER — TETRACAINE HCL 0.5 % OP SOLN
OPHTHALMIC | Status: AC
  Filled 2019-04-11: qty 4

## 2019-04-11 NOTE — ED Triage Notes (Signed)
Pt presents with right eye irritation, swelling, and pain since yesterday.

## 2019-04-11 NOTE — ED Provider Notes (Signed)
Diamond Ramirez    CSN: [id number] Arrival date & time: 04/11/19  1130      History   Chief Complaint Chief Complaint  Patient presents with  . Eye Problem    HPI [place] interpretation via [employer] interpreter Diamond Ramirez is a 44 y.o. female neck injury past medical history presenting today for evaluation of right eye redness and irritation.  Patient states that approximately 2 days ago she woke up and noticed that her right eye was red, was watering more frequently and had some itching and irritation.  She has noticed that her eye has been slightly swollen, but denies pain around her eye.  Denies changes in vision.  Does report occasional temporary blurring with watering, but no true vision changes.  She denies contact use.  Denies associated URI symptoms.  Denies close contacts with similar.  Denies possible trauma/foreign body.  HPI  History reviewed. No pertinent past medical history.  Patient Active Problem List   Diagnosis Date Noted  . Cervical cancer screening 03/11/2019  . Generalized abdominal pain 03/11/2019  . Streptococcal sore throat 06/06/2016  . Sinusitis 12/15/2015  . Dizziness 12/14/2015  . Breast mass, right 08/31/2015  . Healthcare maintenance 08/31/2015  . Sore throat 07/27/2015  . 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 BREAST BIOPSY Right 2016  . RHINOPLASTY N/A     OB History   No obstetric history on file.      Home Medications    Prior to Admission medications   Medication Sig Start Date End Date Taking? Authorizing Provider  fluticasone (FLONASE) 50 MCG/ACT nasal spray SHAKE LQ AND U 2 SPRAYS IEN D 11/29/15   [provider]  olopatadine (PATANOL) 0.1 % ophthalmic solution Place 1 drop into the right eye 2 (two) times daily. 04/11/19   Diamond Dun C, PA-Ramirez  trimethoprim-polymyxin b (POLYTRIM) ophthalmic solution Place 1 drop into the right eye every 6 (six)  hours for 7 days. 04/11/19 04/18/19  Wilene Pharo, Elesa Hacker, PA-Ramirez    Family History Family History  Problem Relation Age of Onset  . Hyperlipidemia Mother   . Hypertension Mother   . Diabetes Father   . Hyperlipidemia Father   . Hypertension Father     Social History Social History   Tobacco Use  . Smoking status: Never Smoker  . Smokeless tobacco: Never Used  Substance Use Topics  . Alcohol use: No  . Drug use: No     Allergies   Patient has no known allergies.   Review of Systems Review of Systems  Constitutional: Negative for activity change, appetite change, chills, fatigue and fever.  HENT: Negative for congestion, ear pain, rhinorrhea, sinus pressure, sore throat and trouble swallowing.   Eyes: Positive for discharge, redness and itching. Negative for pain.  Respiratory: Negative for cough, chest tightness and shortness of breath.   Cardiovascular: Negative for chest pain.  Gastrointestinal: Negative for abdominal pain, diarrhea, nausea and vomiting.  Musculoskeletal: Negative for myalgias.  Skin: Negative for rash.  Neurological: Negative for dizziness, light-headedness and headaches.     Physical Exam Triage Vital Signs ED Triage Vitals  Enc Vitals Group     BP 04/11/19 1336 (!) 105/57     Pulse Rate 04/11/19 1336 (!) 101     Resp 04/11/19 1336 18     Temp 04/11/19 1336 98.6 F (37 Ramirez)     Temp Source 04/11/19 1336 Oral  SpO2 04/11/19 1336 99 %     Weight --      Height --      Head Circumference --      Peak Flow --      Pain Score 04/11/19 1333 8     Pain Loc --      Pain Edu? --      Excl. in GC? --    No data found.  Updated Vital Signs BP (!) 105/57 (BP Location: Right Arm)   Pulse (!) 101   Temp 98.6 F (37 Ramirez) (Oral)   Resp 18   LMP 04/05/2019   SpO2 99%   Visual Acuity Right Eye Distance:   Left Eye Distance:   Bilateral Distance:    Right Eye Near:   Left Eye Near:    Bilateral Near:     Physical Exam Vitals and nursing  note reviewed.  Constitutional:      Appearance: She is well-developed.     Comments: No acute distress  HENT:     Head: Normocephalic and atraumatic.     Ears:     Comments: Bilateral ears without tenderness to palpation of external auricle, tragus and mastoid, EAC's without erythema or swelling, TM's with good bony landmarks and cone of light. Non erythematous.     Nose: Nose normal.     Comments: Nasal mucosa pink, nonswollen turbinates    Mouth/Throat:     Comments: Oral mucosa pink and moist, no tonsillar enlargement or exudate. Posterior pharynx patent and nonerythematous, no uvula deviation or swelling. Normal phonation. Eyes:     Extraocular Movements: Extraocular movements intact.     Conjunctiva/sclera: Conjunctivae normal.     Pupils: Pupils are equal, round, and reactive to light.     Comments: Right eye: Very mild swelling noted to upper and lower lid, no orbital finding erythema or warmth, nontender to palpation to periorbital area, conjunctive appears erythematous, limbus clear.  No significant photophobia with exam.  No fluorescein uptake.  Frequently tearing.  Cardiovascular:     Rate and Rhythm: Normal rate.  Pulmonary:     Effort: Pulmonary effort is normal. No respiratory distress.  Abdominal:     General: There is no distension.  Musculoskeletal:        General: Normal range of motion.     Cervical back: Neck supple.  Skin:    General: Skin is warm and dry.  Neurological:     Mental Status: She is alert and oriented to person, place, and time.      UC Treatments / Results  Labs (all labs ordered are listed, but only abnormal results are displayed) Labs Reviewed - No data to display  EKG   Radiology No results found.  Procedures Procedures (including critical care time)  Medications Ordered in UC Medications - No data to display  Initial Impression / Assessment and Plan / UC Course  I have reviewed the triage vital signs and the nursing  notes.  Pertinent labs & imaging results that were available during my care of the patient were reviewed by me and considered in my medical decision making (see chart for details).     Exam suggestive of conjunctivitis.  Infectious versus allergic.  Will cover with Polytrim as well as olopatadine to help with itching and watering.  Given unilateral treat with Polytrim.  Continue to monitor, no red flags for ocular emergency at this time.  No sign of preseptal/periorbital cellulitis.  No vision changes.Discussed strict return precautions. Patient  verbalized understanding and is agreeable with plan.  Final Clinical Impressions(s) / UC Diagnoses   Final diagnoses:  Acute bacterial conjunctivitis of right eye     Discharge Instructions     Conjunctivitis- this is self-limiting and will improve on its own, may worsen for 3-5 days, but should resolve in 10-14 days  - Have Good Hand Hygiene  - Use Cold Compresses  - Use Polytrim eye drops (1-2 drops 4 times a day for 5-7 days)  - olopatadine drops twice daily for itching/watering     ED Prescriptions    Medication Sig Dispense Auth. Provider   trimethoprim-polymyxin b (POLYTRIM) ophthalmic solution Place 1 drop into the right eye every 6 (six) hours for 7 days. 10 mL Laelah Siravo C, PA-Ramirez   olopatadine (PATANOL) 0.1 % ophthalmic solution Place 1 drop into the right eye 2 (two) times daily. 5 mL Kensie Susman, [place] C, PA-Ramirez     PDMP not reviewed this encounter.   Lew Dawes, [place] 04/11/19 1419

## 2019-04-11 NOTE — Discharge Instructions (Signed)
Conjunctivitis- this is self-limiting and will improve on its own, may worsen for 3-5 days, but should resolve in 10-14 days  - Have Good Hand Hygiene  - Use Cold Compresses  - Use Polytrim eye drops (1-2 drops 4 times a day for 5-7 days)  - olopatadine drops twice daily for itching/watering

## 2019-04-26 ENCOUNTER — Ambulatory Visit (INDEPENDENT_AMBULATORY_CARE_PROVIDER_SITE_OTHER): Payer: BC Managed Care – PPO | Admitting: Student in an Organized Health Care Education/Training Program

## 2019-04-26 ENCOUNTER — Other Ambulatory Visit: Payer: Self-pay

## 2019-04-26 DIAGNOSIS — M659 Synovitis and tenosynovitis, unspecified: Secondary | ICD-10-CM | POA: Diagnosis not present

## 2019-04-26 DIAGNOSIS — I9589 Other hypotension: Secondary | ICD-10-CM

## 2019-04-26 MED ORDER — IBUPROFEN 600 MG PO TABS: 600.0000 mg | ORAL_TABLET | Freq: Three times a day (TID) | ORAL | 0 refills | Status: AC

## 2019-04-26 NOTE — Patient Instructions (Signed)
It was a pleasure to see you today!  To summarize our discussion for this visit:  I'm sorry to hear that you are having hand pain interfering with your work. You likely have inflammation or swelling of the tendons to your thumbs that is caused by overworking them.  To treat, I recommend wearing a splint to help support the thumbs and also taking an anti-inflammatory medication such as ibuprofen 3 times a day for 5 days to see if this will help.  If it does not, we can try a stronger medication or even a steroid injection.  Some additional health maintenance measures we should update are: Health Maintenance Due  Topic Date Due  . INFLUENZA VACCINE  10/31/2018  .    Please return to our clinic to see [place] in about 4 to 6 weeks.  Call the clinic at [phone] if your symptoms worsen or you have any concerns.   Thank you for allowing me to take part in your care,  Dr. Jamelle Rushing   Suzette Battiest Tenosynovitis  Tommi Rumps Quervain's tenosynovitis is a condition that causes inflammation of the tendon on the thumb side of the wrist. Tendons are cords of tissue that connect bones to muscles. The tendons in the hand pass through a tunnel called a sheath. A slippery layer of tissue (synovium) lets the tendons move smoothly in the sheath. With de Quervain's tenosynovitis, the sheath swells or thickens, causing friction and pain. The condition is also called de Quervain's disease and de Quervain's syndrome. It occurs most often in women who are 45-54 years old. What are the causes? The exact cause of this condition is not known. It may be associated with overuse of the hand and wrist. What increases the risk? You are more likely to develop this condition if you:  Use your hands far more than normal, especially if you repeat certain movements that involve twisting your hand or using a tight grip.  Are pregnant.  Are a middle-aged woman.  Have rheumatoid arthritis.  Have diabetes. What are  the signs or symptoms? The main symptom of this condition is pain on the thumb side of the wrist. The pain may get worse when you grasp something or turn your wrist. Other symptoms may include:  Pain that extends up the forearm.  Swelling of your wrist and hand.  Trouble moving the thumb and wrist.  A sensation of snapping in the wrist.  A bump filled with fluid (cyst) in the area of the pain. How is this diagnosed? This condition may be diagnosed based on:  Your symptoms and medical history.  A physical exam. During the exam, your health care provider may do a simple test Lourena Simmonds test) that involves pulling your thumb and wrist to see if this causes pain. You may also need to have an X-ray. How is this treated? Treatment for this condition may include:  Avoiding any activity that causes pain and swelling.  Taking medicines. Anti-inflammatory medicines and corticosteroid injections may be used to reduce inflammation and relieve pain.  Wearing a splint.  Having surgery. This may be needed if other treatments do not work. Once the pain and swelling has gone down:  Physical therapy. This includes stretching and strengthening exercises.  Occupational therapy. This includes adjusting how you move your wrist. Follow these instructions at home: If you have a splint:  Wear the splint as told by your health care provider. Remove it only as told by your health care provider.  Loosen the  splint if your fingers tingle, become numb, or turn cold and blue.  Keep the splint clean.  If the splint is not waterproof: ? Do not let it get wet. ? Cover it with a watertight covering when you take a bath or a shower. Managing pain, stiffness, and swelling   Avoid movements and activities that cause pain and swelling in the wrist area.  If directed, put ice on the painful area. This may be helpful after doing activities that involve the sore wrist. ? Put ice in a plastic bag. ?  Place a towel between your skin and the bag. ? Leave the ice on for 20 minutes, 2-3 times a day.  Move your fingers often to avoid stiffness and to lessen swelling.  Raise (elevate) the injured area above the level of your heart while you are sitting or lying down. General instructions  Return to your normal activities as told by your health care provider. Ask your health care provider what activities are safe for you.  Take over-the-counter and prescription medicines only as told by your health care provider.  Keep all follow-up visits as told by your health care provider. This is important. Contact a health care provider if:  Your pain medicine does not help.  Your pain gets worse.  You develop new symptoms. Summary  De Quervain's tenosynovitis is a condition that causes inflammation of the tendon on the thumb side of the wrist.  The condition occurs most often in women who are 45-35 years old years old.  The exact cause of this condition is not known. It may be associated with overuse of the hand and wrist.  Treatment starts with avoiding activity that causes pain or swelling in the wrist area. Other treatment may include wearing a splint and taking medicine. Sometimes, surgery is needed. This information is not intended to replace advice given to you by your health care provider. Make sure you discuss any questions you have with your health care provider. Document Revised: 09/18/2017 Document Reviewed: 02/24/2017 Elsevier Patient Education  2020 [employer].

## 2019-04-26 NOTE — Progress Notes (Signed)
   Subjective:    Patient ID: Diamond Ramirez, female    DOB: 06/29/1975, 44 y.o.   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  Her left hand worse than right and states that that is the hand that holds her customers hands which are heavier than the tools she uses in her trade.  The pain is very mild in the morning and worsens throughout the end of a long day of work.  Has noticed some swelling at the joints of her MCPs and endorses some weakness in her grip strength.  No numbness or tingling to that area or the other fingers in the medial distribution.  Denies any rashes, fever, other ill symptoms.  She has not tried any medications to make it better but has done hot oil massages at work which do have some mild improvement.  Smoking status reviewed   ROS: pertinent noted in the HPI   I have personally reviewed pertinent past medical history, surgical, family, and social history as appropriate. Objective:  BP (!) 108/52   Pulse 75   Wt 121 lb 3.2 oz (55 kg)   LMP 04/05/2019   SpO2 100%   BMI 22.17 kg/m   Vitals and nursing note reviewed  General: NAD, pleasant, able to participate in exam Extremities: no edema or cyanosis. Bilateral hands/wrists have 5 out of 5 strength. Tenderness to palpation on bilateral wrists just proximal to MCP which is worsened with movement of thumb.  Positive Finkelstein's test bilaterally negative Tinel's test. Skin: warm and dry, no rashes noted Neuro: alert, no obvious focal deficits Psych: Normal affect and mood  Assessment & Plan:   Tenosynovitis of both wrists Likely irritation and inflammation of overuse from patient's occupation.  Low suspicion for scaphoid fracture as there is no history of trauma and pain is bilateral.  Also could consider the possibility of this being a symptom of her  positive RPR but I think this would be a very unlikely presentation.  Precepted this with attending today who agreed. Providing with bilateral thumb splints and ibuprofen to take TID x3 days Discussed with patient to let us know if no improvement and can try either toradol or steroid injection.  Provided patient with handout on information which daughter will help her read. Can also consider occupational therapy to help reduce the risk of reinjury while working.  Hypotension Chronic. 108/52 today  Meds ordered this encounter  Medications  . ibuprofen (ADVIL) 600 MG tablet    Sig: Take 1 tablet (600 mg total) by mouth 3 (three) times daily for 10 days.    Dispense:  30 tablet    Refill:  0    Jamelle Rushing, DO [facility] PGY-2

## 2019-04-26 NOTE — Assessment & Plan Note (Addendum)
Likely irritation and inflammation of overuse from patient's occupation.  Low suspicion for scaphoid fracture as there is no history of trauma and pain is bilateral.  Also could consider the possibility of this being a symptom of her positive RPR but I think this would be a very unlikely presentation.  Precepted this with attending today who agreed. Providing with bilateral thumb splints and ibuprofen to take TID x3 days Discussed with patient to let us know if no improvement and can try either toradol or steroid injection.  Provided patient with handout on information which daughter will help her read. Can also consider occupational therapy to help reduce the risk of reinjury while working.

## 2019-04-27 DIAGNOSIS — I959 Hypotension, unspecified: Secondary | ICD-10-CM | POA: Insufficient documentation

## 2019-04-27 NOTE — Assessment & Plan Note (Signed)
Chronic. 108/52 today

## 2019-04-30 ENCOUNTER — Encounter: Payer: Self-pay | Admitting: Family Medicine

## 2019-04-30 ENCOUNTER — Other Ambulatory Visit: Payer: Self-pay

## 2019-04-30 ENCOUNTER — Ambulatory Visit (INDEPENDENT_AMBULATORY_CARE_PROVIDER_SITE_OTHER): Payer: BC Managed Care – PPO | Admitting: Family Medicine

## 2019-04-30 VITALS — BP 100/60 | HR 86 | Ht 62.0 in | Wt 122.0 lb

## 2019-04-30 DIAGNOSIS — M659 Synovitis and tenosynovitis, unspecified: Secondary | ICD-10-CM | POA: Diagnosis not present

## 2019-04-30 DIAGNOSIS — R1084 Generalized abdominal pain: Secondary | ICD-10-CM

## 2019-04-30 LAB — POCT URINE PREGNANCY: Preg Test, Ur: NEGATIVE

## 2019-04-30 NOTE — Assessment & Plan Note (Signed)
Unable to tolerate NSAID Would benefit from evaluation by specialist, possible injections Refer to sports medicine, patient agreeable

## 2019-04-30 NOTE — Assessment & Plan Note (Signed)
Ongoing for months, prior neg RUQ u/s. History does not point to a particular cause. May be functional/IBS but worth investigating further. She complains of pain in both RUQ and RLQ, and she is tender in RLQ. Differential diagnosis includes IBS, IBD (esp with location near terminal ileum in RLQ), constipation, biliary dyskinesia. No signs of acute abdomen on exam. Blood pressure is soft but chronically low (patient is small and healthy). Plan: labs including preg test, CMET, CBC with diff, lipase Also check CT abdomen/pelvis with contrast Further workup pending these results

## 2019-04-30 NOTE — Progress Notes (Signed)
  Date of Visit: 04/30/2019   HPI:  [place] interpreter utilized during this visit.   Diamond Ramirez presents today for two issues:  1) bilateral thumb/wrist pain - seen by Dr. Dareen Piano a few days ago and diagnosed with likely tenosynovitis of bilateral wrists. Given advice to take ibuprofen, which patient did. It did help with pain, but has resulted in increased pain in her abdomen, caused just generalized abdominal discomfort. She wants to know what else she can do. She has tried wrist splints but they were too big and did not keep her thumbs steady.  2) chronic abdominal pain - for several months has had pain in her RUQ and RLQ. Comes in waves, lasting 10-20 minutes, several times per day. Unrelated to eating or stooling. Has bowel movement once per day, no blood, not excessively hard stools. Had some labs done in December to investigate, also had RUQ ultrasound, all of which was unremarkable. Denies pelvic pain. LMP 04/16/19. Denies possibility of pregnancy.  PMFSH: history of latent syphilis, treated appropriately  PHYSICAL EXAM: BP 100/60   Pulse 86   Ht 5\' 2"  (1.575 m)   Wt 122 lb (55.3 kg)   LMP 04/05/2019   SpO2 98%   BMI 22.31 kg/m  Gen: no acute distress, pleasant, cooperative, well appearing HEENT: normocephalic, atraumatic  Lungs: normal respiratory effort Abdomen: soft, no masses or organomegaly. Tender to palpation in RLQ>RUQ. No rebound or guarding. No peritoneal signs.  Neuro: speech normal, grossly nonfocal Ext: bilateral wrists with full range of motion. Full grip bilaterally. No thenar atrophy. Neg phalens and tinels. tender to palpation at base of bilateral first metacarpals on palmar side. Equivocal finkelsteins  ASSESSMENT/PLAN:  Health maintenance:  -current on HM items  Tenosynovitis of both wrists Unable to tolerate NSAID Would benefit from evaluation by specialist, possible injections Refer to sports medicine, patient agreeable  Generalized abdominal pain  Ongoing for months, prior neg RUQ u/s. History does not point to a particular cause. May be functional/IBS but worth investigating further. She complains of pain in both RUQ and RLQ, and she is tender in RLQ. Differential diagnosis includes IBS, IBD (esp with location near terminal ileum in RLQ), constipation, biliary dyskinesia. No signs of acute abdomen on exam. Blood pressure is soft but chronically low (patient is small and healthy). Plan: labs including preg test, CMET, CBC with diff, lipase Also check CT abdomen/pelvis with contrast Further workup pending these results   06/03/2019 J. [place], MD [facility]

## 2019-04-30 NOTE — Patient Instructions (Signed)
It was great to see you again today!  Checking labs and CT scan Referring to sports medicine for your thumb pain  Be well, Dr. Pollie Meyer

## 2019-05-01 ENCOUNTER — Encounter: Payer: Self-pay | Admitting: Family Medicine

## 2019-05-01 LAB — CMP14+EGFR
ALT: 16 IU/L (ref 0–32)
AST: 17 IU/L (ref 0–40)
Albumin/Globulin Ratio: 1.8 (ref 1.2–2.2)
Albumin: 4.2 g/dL (ref 3.8–4.8)
Alkaline Phosphatase: 54 IU/L (ref 39–117)
BUN/Creatinine Ratio: 13 (ref 9–23)
BUN: 9 mg/dL (ref 6–24)
Bilirubin Total: 0.3 mg/dL (ref 0.0–1.2)
CO2: 25 mmol/L (ref 20–29)
Calcium: 8.8 mg/dL (ref 8.7–10.2)
Chloride: 105 mmol/L (ref 96–106)
Creatinine, Ser: 0.7 mg/dL (ref 0.57–1.00)
GFR calc Af Amer: 122 mL/min/{1.73_m2} (ref >59)
GFR calc non Af Amer: 106 mL/min/{1.73_m2} (ref >59)
Globulin, Total: 2.4 g/dL (ref 1.5–4.5)
Glucose: 81 mg/dL (ref 65–99)
Potassium: 4 mmol/L (ref 3.5–5.2)
Sodium: 142 mmol/L (ref 134–144)
Total Protein: 6.6 g/dL (ref 6.0–8.5)

## 2019-05-01 LAB — CBC WITH DIFFERENTIAL/PLATELET
Basophils Absolute: 0 10*3/uL (ref 0.0–0.2)
Basos: 0 %
EOS (ABSOLUTE): 0.2 10*3/uL (ref 0.0–0.4)
Eos: 3 %
Hematocrit: 39.3 % (ref 34.0–46.6)
Hemoglobin: 12.9 g/dL (ref 11.1–15.9)
Immature Grans (Abs): 0 10*3/uL (ref 0.0–0.1)
Immature Granulocytes: 0 %
Lymphocytes Absolute: 1.7 10*3/uL (ref 0.7–3.1)
Lymphs: 31 %
MCH: 31.1 pg (ref 26.6–33.0)
MCHC: 32.8 g/dL (ref 31.5–35.7)
MCV: 95 fL (ref 79–97)
Monocytes Absolute: 0.6 10*3/uL (ref 0.1–0.9)
Monocytes: 11 %
Neutrophils Absolute: 3 10*3/uL (ref 1.4–7.0)
Neutrophils: 55 %
Platelets: 232 10*3/uL (ref 150–450)
RBC: 4.15 x10E6/uL (ref 3.77–5.28)
RDW: 11.8 % (ref 11.7–15.4)
WBC: 5.5 10*3/uL (ref 3.4–10.8)

## 2019-05-01 LAB — LIPASE: Lipase: 46 U/L (ref 14–72)

## 2019-05-05 ENCOUNTER — Encounter: Payer: Self-pay | Admitting: Sports Medicine

## 2019-05-05 ENCOUNTER — Ambulatory Visit: Payer: BC Managed Care – PPO | Admitting: Sports Medicine

## 2019-05-05 ENCOUNTER — Other Ambulatory Visit: Payer: Self-pay

## 2019-05-05 VITALS — BP 102/64 | Ht 63.0 in | Wt 119.0 lb

## 2019-05-05 DIAGNOSIS — M654 Radial styloid tenosynovitis [de Quervain]: Secondary | ICD-10-CM | POA: Diagnosis not present

## 2019-05-05 NOTE — Progress Notes (Signed)
Today's interpreter is [place].

## 2019-05-05 NOTE — Progress Notes (Addendum)
PCP: Latrelle Dodrill, MD  Subjective:   HPI: Patient is a 44 y.o. female here for evaluation of bilateral wrist pain.  Patient notes pain is been present for last several months.  Is located on her radial side of her wrist and radiates down into her thumb.  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  Work is been aggravating her symptoms.  She also notes some numbness and tingling in the same area.  Patient denies any injury or trauma.  Patient saw her PCP last week who gave her wrist braces however she notes these have not been fitting her well.  She was also given ibuprofen which she notes significantly helped improve her pain however she is not able to take this as it has been bothering her stomach.   Review of Systems: See HPI above.  History reviewed. No pertinent past medical history.  Current Outpatient Medications on File Prior to Visit  Medication Sig Dispense Refill  . fluticasone (FLONASE) 50 MCG/ACT nasal spray SHAKE LQ AND U 2 SPRAYS IEN D  5  . ibuprofen (ADVIL) 600 MG tablet Take 1 tablet (600 mg total) by mouth 3 (three) times daily for 10 days. (Patient not taking: Reported on 05/05/2019) 30 tablet 0  . olopatadine (PATANOL) 0.1 % ophthalmic solution Place 1 drop into the right eye 2 (two) times daily. (Patient not taking: Reported on 05/05/2019) 5 mL 0   No current facility-administered medications on file prior to visit.    Past Surgical History:  Procedure Laterality Date  . BREAST BIOPSY Right 2016  . RHINOPLASTY N/A     No Known Allergies  Social History   Socioeconomic History  . Marital status: Married    Spouse name: Not on file  . Number of children: Not on file  . Years of education: Not on file  . Highest education level: Not on file  Occupational History  . Not on file  Tobacco Use  . Smoking status: Never Smoker  . Smokeless tobacco: Never Used  Substance and Sexual Activity  . Alcohol use: No  . Drug use: No  .  Sexual activity: Not on file  Other Topics Concern  . Not on file  Social History Narrative   Primary Language: [place]   Employment: [employer]   Education: [employer]   Married -    [phone] (vaginal deliveries, miscarriage was spontaneous)   Social Determinants of Health   Financial Resource Strain:   . Difficulty of Paying Living Expenses: Not on file  Food Insecurity:   . Worried About Programme researcher, broadcasting/film/video in the Last Year: Not on file  . Ran Out of Food in the Last Year: Not on file  Transportation Needs:   . Lack of Transportation (Medical): Not on file  . Lack of Transportation (Non-Medical): Not on file  Physical Activity:   . Days of Exercise per Week: Not on file  . Minutes of Exercise per Session: Not on file  Stress:   . Feeling of Stress : Not on file  Social Connections:   . Frequency of Communication with Friends and Family: Not on file  . Frequency of Social Gatherings with Friends and Family: Not on file  . Attends Religious Services: Not on file  . Active Member of Clubs or Organizations: Not on file  . Attends Banker Meetings: Not on file  . Marital Status: Not on file  Intimate Partner Violence:   .  Fear of Current or Ex-Partner: Not on file  . Emotionally Abused: Not on file  . Physically Abused: Not on file  . Sexually Abused: Not on file    Family History  Problem Relation Age of Onset  . Hyperlipidemia Mother   . Hypertension Mother   . Diabetes Father   . Hyperlipidemia Father   . Hypertension Father         Objective:  Physical Exam: BP 102/64   Ht 5\' 3"  (1.6 m)   Wt 119 lb (54 kg)   LMP 04/05/2019   BMI 21.08 kg/m  Gen: NAD, comfortable in exam room Lungs: Breathing comfortably on room air Hand/wrist Exam Bilateral -Inspection: No deformity, no discoloration -Palpation: Tenderness palpation over the first dorsal compartment -ROM: Normal range of motion with flexion, extension, radial deviation, ulnar  deviation, pronation, supination -Strength: Flexion: 5/5; Extension: 5/5; Radial Deviation: 5/5; Ulnar Deviation: 5/5 -Special Tests: Tinels: Negative; Phalens: Negative; Finkelstein's: Positive -Limbs neurovascularly intact   Assessment & Plan:  Patient is a 44 y.o. female here for evaluation of bilateral wrist pain  1.  Bilateral de Quervain's tenosynovitis -Patient was fitted for thumb spica braces today.  She was advised to wear these as much as able to.  She will likely need to remove these during parts of the workday to do her job -Patient was given information on Voltaren gel.  She will try this instead of the ibuprofen to see if this improves her pain without affecting her stomach -Patient was offered corticosteroid injection today however she declined this.  If the above therapies do not work she will follow-up as needed and will consider ultrasound-guided steroid injection of her first dorsal compartment  Patient follow-up as needed  Addendum:  I was the preceptor for this visit and available for immediate consultation.  Karlton Lemon MD [facility]

## 2019-05-05 NOTE — Patient Instructions (Signed)
The pain in your wrist is caused by tendinitis of 2 of your thumb tendons.  This is also referred to as de Quervain's tenosynovitis  -The brace is given to you at today's visit will help rest your thumb which will allow the inflammation and the tendons to decrease.  Wear this brace is much as you are able to however you will likely need to take it off intermittently at work to do your job  -Go to the pharmacy to get Voltaren gel.  This is an anti-inflammatory cream that will not upset your stomach  -If the Voltaren gel does not improve your pain you can call our office or return for another visit.  At that point we can try different anti-inflammatory that you take by mouth or we can consider a steroid injection into the tendon  Come back to see me if your pain is not improving

## 2019-05-10 ENCOUNTER — Other Ambulatory Visit: Payer: Self-pay

## 2019-05-10 ENCOUNTER — Encounter: Payer: Self-pay | Admitting: Family Medicine

## 2019-05-10 ENCOUNTER — Ambulatory Visit
Admission: RE | Admit: 2019-05-10 | Discharge: 2019-05-10 | Disposition: A | Discharge status: Home / Self Care | Payer: BC Managed Care – PPO | Source: Ambulatory Visit | Attending: Family Medicine | Admitting: Family Medicine

## 2019-05-10 DIAGNOSIS — R1011 Right upper quadrant pain: Secondary | ICD-10-CM | POA: Diagnosis not present

## 2019-05-10 DIAGNOSIS — D259 Leiomyoma of uterus, unspecified: Secondary | ICD-10-CM | POA: Diagnosis not present

## 2019-05-10 DIAGNOSIS — R1084 Generalized abdominal pain: Secondary | ICD-10-CM

## 2019-05-10 MED ORDER — IOPAMIDOL (ISOVUE-300) INJECTION 61%
100.0000 mL | Freq: Once | INTRAVENOUS | Status: AC | PRN
  Administered 2019-05-10: 10:00:00 100 mL via INTRAVENOUS

## 2019-09-28 ENCOUNTER — Other Ambulatory Visit: Payer: Self-pay | Admitting: Family Medicine

## 2019-09-28 DIAGNOSIS — Z1231 Encounter for screening mammogram for malignant neoplasm of breast: Secondary | ICD-10-CM

## 2019-10-25 ENCOUNTER — Ambulatory Visit: Payer: BC Managed Care – PPO

## 2019-11-02 ENCOUNTER — Ambulatory Visit
Admission: RE | Admit: 2019-11-02 | Discharge: 2019-11-02 | Disposition: A | Discharge status: Home / Self Care | Payer: BC Managed Care – PPO | Source: Ambulatory Visit | Attending: Family Medicine | Admitting: Family Medicine

## 2019-11-02 ENCOUNTER — Other Ambulatory Visit: Payer: Self-pay

## 2019-11-02 DIAGNOSIS — Z1231 Encounter for screening mammogram for malignant neoplasm of breast: Secondary | ICD-10-CM

## 2020-04-17 ENCOUNTER — Ambulatory Visit: Payer: BC Managed Care – PPO

## 2020-04-17 NOTE — Patient Instructions (Addendum)
Thank you for coming to see me today. It was a pleasure.   Your last ultrasound and CT of your abdomen did not show any gallstones.  Referal sent to GI.  They will call you with an appointment  Please start Miralax 2 scoops twice a day. It may take up to 3 days before you see any results.  If you start having diarrhea cut down to once a day.  Please follow-up with PCP as needed  If you have any questions or concerns, please do not hesitate to call the office at [phone].  Best,   Dana Allan, MD   Irritable Bowel Syndrome, Adult  Irritable bowel syndrome (IBS) is a group of symptoms that affects the organs responsible for digestion (gastrointestinal or GI tract). IBS is not one specific disease. To regulate how the GI tract works, the body sends signals back and forth between the intestines and the brain. If you have IBS, there may be a problem with these signals. As a result, the GI tract does not function normally. The intestines may become more sensitive and overreact to certain things. This may be especially true when you eat certain foods or when you are under stress. There are four types of IBS. These may be determined based on the consistency of your stool (feces):  IBS with diarrhea.  IBS with constipation.  Mixed IBS.  Unsubtyped IBS. It is important to know which type of IBS you have. Certain treatments are more likely to be helpful for certain types of IBS. What are the causes? The exact cause of IBS is not known. What increases the risk? You may have a higher risk for IBS if you:  Are female.  Are younger than 27.  Have a family history of IBS.  Have a mental health condition, such as depression, anxiety, or post-traumatic stress disorder.  Have had a bacterial infection of your GI tract. What are the signs or symptoms? Symptoms of IBS vary from person to person. The main symptom is abdominal pain or discomfort. Other symptoms usually include one or  more of the following:  Diarrhea, constipation, or both.  Abdominal swelling or bloating.  Feeling full after eating a small or regular-sized meal.  Frequent gas.  Mucus in the stool.  A feeling of having more stool left after a bowel movement. Symptoms tend to come and go. They may be triggered by stress, mental health conditions, or certain foods. How is this diagnosed? This condition may be diagnosed based on a physical exam, your medical history, and your symptoms. You may have tests, such as:  Blood tests.  Stool test.  X-rays.  CT scan.  Colonoscopy. This is a procedure in which your GI tract is viewed with a long, thin, flexible tube. How is this treated? There is no cure for IBS, but treatment can help relieve symptoms. Treatment depends on the type of IBS you have, and may include:  Changes to your diet, such as: ? Avoiding foods that cause symptoms. ? Drinking more water. ? Following a low-FODMAP (fermentable oligosaccharides, disaccharides, monosaccharides, and polyols) diet for up to 6 weeks, or as told by your health care provider. FODMAPs are sugars that are hard for some people to digest. ? Eating more fiber. ? Eating medium-sized meals at the same times every day.  Medicines. These may include: ? Fiber supplements, if you have constipation. ? Medicine to control diarrhea (antidiarrheal medicines). ? Medicine to help control muscle tightening (spasms) in your GI  tract (antispasmodic medicines). ? Medicines to help with mental health conditions, such as antidepressants or tranquilizers.  Talk therapy or counseling.  Working with a diet and nutrition specialist (dietitian) to help create a food plan that is right for you.  Managing your stress. Follow these instructions at home: Eating and drinking  Eat a healthy diet.  Eat medium-sized meals at about the same time every day. Do not eat large meals.  Gradually eat more fiber-rich foods. These include  whole grains, fruits, and vegetables. This may be especially helpful if you have IBS with constipation.  Eat a diet low in FODMAPs.  Drink enough fluid to keep your urine pale yellow.  Keep a journal of foods that seem to trigger symptoms.  Avoid foods and drinks that: ? Contain added sugar. ? Make your symptoms worse. Dairy products, caffeinated drinks, and carbonated drinks can make symptoms worse for some people. General instructions  Take over-the-counter and prescription medicines and supplements only as told by your health care provider.  Get enough exercise. Do at least 150 minutes of moderate-intensity exercise each week.  Manage your stress. Getting enough sleep and exercise can help you manage stress.  Keep all follow-up visits as told by your health care provider and therapist. This is important. Alcohol Use  Do not drink alcohol if: ? Your health care provider tells you not to drink. ? You are pregnant, may be pregnant, or are planning to become pregnant.  If you drink alcohol, limit how much you have: ? 0-1 drink a day for women. ? 0-2 drinks a day for men.  Be aware of how much alcohol is in your drink. In the [place] one drink equals one typical bottle of beer (12 oz), one-half glass of wine (5 oz), or one shot of hard liquor (1 oz). Contact a health care provider if you have:  Constant pain.  Weight loss.  Difficulty or pain when swallowing.  Diarrhea that gets worse. Get help right away if you have:  Severe abdominal pain.  Fever.  Diarrhea with symptoms of dehydration, such as dizziness or dry mouth.  Bright red blood in your stool.  Stool that is black and tarry.  Abdominal swelling.  Vomiting that does not stop.  Blood in your vomit. Summary  Irritable bowel syndrome (IBS) is not one specific disease. It is a group of symptoms that affects digestion.  Your intestines may become more sensitive and overreact to certain things. This may be  especially true when you eat certain foods or when you are under stress.  There is no cure for IBS, but treatment can help relieve symptoms. This information is not intended to replace advice given to you by your health care provider. Make sure you discuss any questions you have with your health care provider. Document Revised: 11/18/2019 Document Reviewed: 11/18/2019 Elsevier Patient Education  2021 [employer].

## 2020-04-17 NOTE — Progress Notes (Unsigned)
    SUBJECTIVE:   CHIEF COMPLAINT / HPI:   History obtained from patients niece Marcelino Duster who interpreted in [place] language.  Chronic abdominal pain Patient reports chronic right upper abdominal pain. Has been ongoing since last year.  Pain starts midepigastric and radiates along lateral side of right thorax and down to right lower quadrant.  Reports pain is sharp, intermittent, worse when laying on back and when eating.  Endorses constipation and pain has worsened since.  Denies any fevers, SOB, chest pain, nausea or vomiting.  Denies any urinary symptoms or bloody stool.  Eats mostly vegetables and drinks lots of water.  No NSAIDs, Tobacco,m ETOH use.  She is currently taking Omeprazole 20 mg daily.  Patient reports had a family member that recently became ill suddenly with infection caused by gallbladder and she is concerned that this may happen to her as she has been having ongoing pain and thinks it is gallbladder related.   PERTINENT  PMH / PSH:  Chronic Sore Throat Laryngopharyngeal Reflux GERD Globus Sensation  OBJECTIVE:   BP 98/60   Pulse 77   Wt 115 lb 12.8 oz (52.5 kg)   LMP 04/05/2020 (Exact Date)   SpO2 99%   BMI 20.51 kg/m    General: Alert, no acute distress Cardio: Normal S1 and S2, RRR, no r/m/g Pulm: CTAB, normal work of breathing Abdomen: soft, flat.  No pulsations, organomegaly appreciated.  Mild tenderness along RU/LQ,  Negative Murphys, Rebound tenderness, psoas sign.  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  Last u/s 12/20 and CT abd 02/21 unremarkable. Last seen by GI 2017 reports had EGD done and was normal.  No results seen on chart. Less likely acute abdomen given ongoing symptoms for 1 year. -Miralax BID until BM then daily -Refer GI for further evaluation -Considered H Pylori test but patient taking daily Omeprazole. -UPT negative -Follow up with PCP as  needed    Healthcare maintenance Covid vaccine: Has received COVID x 3 Flu vaccine: 10/20 Colonoscopy: GI referral today Pap: 12/20 due 12/23 Mammogram: 08/21 due 08/22 Tdap: due 03/26 HIV and Hep C screening today     Dana Allan, MD [facility]

## 2020-04-18 ENCOUNTER — Other Ambulatory Visit: Payer: Self-pay

## 2020-04-18 ENCOUNTER — Ambulatory Visit: Payer: BC Managed Care – PPO | Admitting: Family Medicine

## 2020-04-18 VITALS — BP 98/60 | HR 77 | Wt 115.8 lb

## 2020-04-18 DIAGNOSIS — Z0000 Encounter for general adult medical examination without abnormal findings: Secondary | ICD-10-CM | POA: Diagnosis not present

## 2020-04-18 DIAGNOSIS — R109 Unspecified abdominal pain: Secondary | ICD-10-CM

## 2020-04-18 DIAGNOSIS — R1084 Generalized abdominal pain: Secondary | ICD-10-CM | POA: Diagnosis not present

## 2020-04-18 LAB — POCT URINE PREGNANCY: Preg Test, Ur: NEGATIVE

## 2020-04-18 MED ORDER — POLYETHYLENE GLYCOL 3350 17 GM/SCOOP PO POWD: 17.0000 g | Freq: Two times a day (BID) | ORAL | 1 refills | Status: DC | PRN

## 2020-04-18 NOTE — Assessment & Plan Note (Addendum)
Likely symptoms related to IBS with constipation.  Last u/s 12/20 and CT abd 02/21 unremarkable. Last seen by GI 2017 reports had EGD done and was normal.  No results seen on chart. Less likely acute abdomen given ongoing symptoms for 1 year. -Miralax BID until BM then daily -Refer GI for further evaluation -Considered H Pylori test but patient taking daily Omeprazole. -UPT negative -Follow up with PCP as needed

## 2020-04-18 NOTE — Assessment & Plan Note (Signed)
Covid vaccine: Has received COVID x 3 Flu vaccine: 10/20 Colonoscopy: GI referral today Pap: 12/20 due 12/23 Mammogram: 08/21 due 08/22 Tdap: due 03/26 HIV and Hep C screening today

## 2020-04-19 DIAGNOSIS — Z113 Encounter for screening for infections with a predominantly sexual mode of transmission: Secondary | ICD-10-CM | POA: Diagnosis not present

## 2020-04-19 DIAGNOSIS — Z01419 Encounter for gynecological examination (general) (routine) without abnormal findings: Secondary | ICD-10-CM | POA: Diagnosis not present

## 2020-04-19 DIAGNOSIS — Z130 Encounter for screening for diseases of the blood and blood-forming organs and certain disorders involving the immune mechanism: Secondary | ICD-10-CM | POA: Diagnosis not present

## 2020-04-19 DIAGNOSIS — Z124 Encounter for screening for malignant neoplasm of cervix: Secondary | ICD-10-CM | POA: Diagnosis not present

## 2020-04-19 DIAGNOSIS — Z1389 Encounter for screening for other disorder: Secondary | ICD-10-CM | POA: Diagnosis not present

## 2020-04-19 DIAGNOSIS — R1030 Lower abdominal pain, unspecified: Secondary | ICD-10-CM | POA: Diagnosis not present

## 2020-04-19 LAB — HCV INTERPRETATION

## 2020-04-19 LAB — HCV AB W REFLEX TO QUANT PCR: HCV Ab: <0.1 s/co ratio (ref 0.0–0.9)

## 2020-04-19 LAB — HIV ANTIBODY (ROUTINE TESTING W REFLEX): HIV Screen 4th Generation wRfx: NONREACTIVE

## 2020-04-20 DIAGNOSIS — Z124 Encounter for screening for malignant neoplasm of cervix: Secondary | ICD-10-CM | POA: Diagnosis not present

## 2020-04-21 DIAGNOSIS — R1030 Lower abdominal pain, unspecified: Secondary | ICD-10-CM | POA: Diagnosis not present

## 2020-04-21 DIAGNOSIS — Z3492 Encounter for supervision of normal pregnancy, unspecified, second trimester: Secondary | ICD-10-CM | POA: Diagnosis not present

## 2020-04-21 DIAGNOSIS — R109 Unspecified abdominal pain: Secondary | ICD-10-CM | POA: Diagnosis not present

## 2020-04-25 ENCOUNTER — Encounter: Payer: BC Managed Care – PPO | Admitting: Family Medicine

## 2020-05-02 ENCOUNTER — Other Ambulatory Visit: Payer: Self-pay

## 2020-05-02 ENCOUNTER — Encounter: Payer: Self-pay | Admitting: Gastroenterology

## 2020-05-02 ENCOUNTER — Other Ambulatory Visit (INDEPENDENT_AMBULATORY_CARE_PROVIDER_SITE_OTHER): Payer: BC Managed Care – PPO

## 2020-05-02 ENCOUNTER — Ambulatory Visit: Payer: BC Managed Care – PPO | Admitting: Gastroenterology

## 2020-05-02 DIAGNOSIS — R1013 Epigastric pain: Secondary | ICD-10-CM

## 2020-05-02 DIAGNOSIS — R1011 Right upper quadrant pain: Secondary | ICD-10-CM | POA: Diagnosis not present

## 2020-05-02 LAB — COMPREHENSIVE METABOLIC PANEL
ALT: 13 U/L (ref 0–35)
AST: 15 U/L (ref 0–37)
Albumin: 4.6 g/dL (ref 3.5–5.2)
Alkaline Phosphatase: 56 U/L (ref 39–117)
BUN: 9 mg/dL (ref 6–23)
CO2: 30 mEq/L (ref 19–32)
Calcium: 9.9 mg/dL (ref 8.4–10.5)
Chloride: 103 mEq/L (ref 96–112)
Creatinine, Ser: 0.69 mg/dL (ref 0.40–1.20)
GFR: 104.47 mL/min (ref >60.00)
Glucose, Bld: 94 mg/dL (ref 70–99)
Potassium: 3.6 mEq/L (ref 3.5–5.1)
Sodium: 138 mEq/L (ref 135–145)
Total Bilirubin: 0.8 mg/dL (ref 0.2–1.2)
Total Protein: 7.5 g/dL (ref 6.0–8.3)

## 2020-05-02 LAB — CBC
HCT: 39.6 % (ref 36.0–46.0)
Hemoglobin: 13.6 g/dL (ref 12.0–15.0)
MCHC: 34.4 g/dL (ref 30.0–36.0)
MCV: 92 fl (ref 78.0–100.0)
Platelets: 211 10*3/uL (ref 150.0–400.0)
RBC: 4.31 Mil/uL (ref 3.87–5.11)
RDW: 12.2 % (ref 11.5–15.5)
WBC: 4.8 10*3/uL (ref 4.0–10.5)

## 2020-05-02 MED ORDER — OMEPRAZOLE 40 MG PO CPDR: 40.0000 mg | DELAYED_RELEASE_CAPSULE | Freq: Every day | ORAL | 3 refills | Status: DC

## 2020-05-02 NOTE — Progress Notes (Signed)
HPI: This is a very pleasant 45 year old [place] speaking only woman who was referred to me by Latrelle Dodrill, MD  to evaluate    She is here with a professional interpreter  She has had epigastric, right upper quadrant pains for at least 1 year.  The pains are there constantly.  They can radiate to her back at times.  They are worse at times.  Eating tends to make it worse.  Sometimes she has burning when she eats.  The burning is in her epigastric region.  She does not have dysphagia.  She tried omeprazole but only 1 or 2 pills and said it did not help.  She does not take NSAIDs very frequently.  She has lost 1 pound in the past 6 months.  She has had no overt GI bleeding.  She tends to be constipated and MiraLAX helps quite well.  She is fairly certain that she had some type of endoscopic evaluation at [facility] in the past 2 or 3 years.  I am unable to find any of those test results after thorough examination of the epic system.  I think she might be confused with an ENT evaluation 2 or 3 years ago.    Old Data Reviewed: Blood work January 2021; CBC, complete metabolic profile, lipase were all normal  Lab testing January 2022 HIV negative, hepatitis C negative, urine pregnancy negative  CT scan abdomen pelvis with IV and oral contrast February 2021 indication "right upper quadrant pain and tenderness for 4 months.  Constipation."  Findings "no acute findings within the abdomen or pelvis.  1.5 cm uterine fibroid."  Right upper quadrant abdominal ultrasound December 2021 indication "generalized abdominal pain.  Right upper quadrant pain with potential gallbladder stone."  Findings "unremarkable right upper quadrant ultrasound, as detailed."  No gallstones seen.   Review of systems: Pertinent positive and negative review of systems were noted in the above HPI section. All other review negative.   History reviewed. No pertinent past medical history.  Past Surgical  History:  Procedure Laterality Date  . TONSILECTOMY, ADENOIDECTOMY, BILATERAL MYRINGOTOMY AND TUBES      Current Outpatient Medications  Medication Sig Dispense Refill  . polyethylene glycol powder (GLYCOLAX/MIRALAX) 17 GM/SCOOP powder Take 17 g by mouth 2 (two) times daily as needed. 3350 g 1   No current facility-administered medications for this visit.    Allergies as of 05/02/2020  . (No Known Allergies)    Family History  Problem Relation Age of Onset  . Hyperlipidemia Mother   . Hypertension Mother   . Liver disease Mother        Elevated liver enzymes; jaundice  . Diabetes Father   . Hyperlipidemia Father   . Hypertension Father   . Liver disease Brother        Elavated liver enzymes; jaundice  . Liver disease Maternal Grandmother        Elevated liver enzymes  . Colon cancer Neg Hx   . Esophageal cancer Neg Hx   . Pancreatic cancer Neg Hx   . Stomach cancer Neg Hx     Social History   Socioeconomic History  . Marital status: Married    Spouse name: Not on file  . Number of children: Not on file  . Years of education: Not on file  . Highest education level: Not on file  Occupational History  . Not on file  Tobacco Use  . Smoking status: Never Smoker  . Smokeless tobacco: Never Used  Vaping Use  . Vaping Use: Never used  Substance and Sexual Activity  . Alcohol use: No  . Drug use: No  . Sexual activity: Not Currently  Other Topics Concern  . Not on file  Social History Narrative   Primary Language: [place]   Employment: Education administrator   Education: [employer]   Married -    [phone] (vaginal deliveries, miscarriage was spontaneous)   Social Determinants of Corporate investment banker Strain: Not on file  Food Insecurity: Not on file  Transportation Needs: Not on file  Physical Activity: Not on file  Stress: Not on file  Social Connections: Not on file  Intimate Partner Violence: Not on file    Physical Exam: Pulse 93   Ht 5\' 3"   (1.6 m)   Wt 116 lb (52.6 kg)   LMP 04/05/2020 (Exact Date)   SpO2 99%   BMI 20.55 kg/m  Constitutional: generally well-appearing Psychiatric: alert and oriented x3 Eyes: extraocular movements intact Mouth: oral pharynx moist, no lesions Neck: supple no lymphadenopathy Cardiovascular: heart regular rate and rhythm Lungs: clear to auscultation bilaterally Abdomen: soft, nontender, nondistended, no obvious ascites, no peritoneal signs, normal bowel sounds Extremities: no lower extremity edema bilaterally Skin: no lesions on visible extremities   Assessment and plan: 45 y.o. female with chronic epigastric, right upper quadrant pains  Unclear etiology.  Given ultrasound in 2021 without gallstones it is less likely to be gallbladder related.  She does not take NSAIDs.  She does have a burning sensation in her stomach and so perhaps this is acid related.  I recommended that she start taking omeprazole 40 mg pills 1 pill once daily and have given a prescription for 1 month with 4 refills.  She is going to get a basic set of labs today including a CBC and complete metabolic profile and we will arrange an EGD at her soonest convenience.    Please see the "Patient Instructions" section for addition details about the plan.   2022, MD [facility] 05/02/2020, 8:33 AM  Cc: 06/30/2020, MD  Total time on date of encounter was 45  minutes (this included time spent preparing to see the patient reviewing records; obtaining and/or reviewing separately obtained history; performing a medically appropriate exam and/or evaluation; counseling and educating the patient and family if present; ordering medications, tests or procedures if applicable; and documenting clinical information in the health record).

## 2020-05-02 NOTE — Patient Instructions (Signed)
If you are age 46 or younger, your body mass index should be between 19-25. Your Body mass index is 20.55 kg/m. If this is out of the aformentioned range listed, please consider follow up with your Primary Care Provider.   You have been scheduled for an endoscopy. Please follow written instructions given to you at your visit today. If you use inhalers (even only as needed), please bring them with you on the day of your procedure.  Due to recent changes in healthcare laws, you may see the results of your imaging and laboratory studies on MyChart before your provider has had a chance to review them.  We understand that in some cases there may be results that are confusing or concerning to you. Not all laboratory results come back in the same time frame and the provider may be waiting for multiple results in order to interpret others.  Please give [place] 48 hours in order for your provider to thoroughly review all the results before contacting the office for clarification of your results.   Your provider has requested that you go to the basement level for lab work before leaving today. Press "B" on the elevator. The lab is located at the first door on the left as you exit the elevator.  We have sent the following medications to your pharmacy for you to pick up at your convenience:  START: Omeprazole 40mg  take one capsule shortly before breakfast meal each day.  Thank you for entrusting me with your care and choosing [facility].  Dr [facility]

## 2020-05-03 DIAGNOSIS — N889 Noninflammatory disorder of cervix uteri, unspecified: Secondary | ICD-10-CM | POA: Diagnosis not present

## 2020-05-03 DIAGNOSIS — R87616 Satisfactory cervical smear but lacking transformation zone: Secondary | ICD-10-CM | POA: Diagnosis not present

## 2020-05-16 ENCOUNTER — Other Ambulatory Visit: Payer: Self-pay

## 2020-05-16 ENCOUNTER — Encounter: Payer: Self-pay | Admitting: Gastroenterology

## 2020-05-16 ENCOUNTER — Ambulatory Visit (AMBULATORY_SURGERY_CENTER): Payer: BC Managed Care – PPO | Admitting: Gastroenterology

## 2020-05-16 VITALS — BP 94/53 | HR 73 | Temp 97.8°F | Resp 15 | Ht 63.0 in | Wt 116.0 lb

## 2020-05-16 DIAGNOSIS — R1013 Epigastric pain: Secondary | ICD-10-CM | POA: Diagnosis not present

## 2020-05-16 MED ORDER — SODIUM CHLORIDE 0.9 % IV SOLN: 500.0000 mL | Freq: Once | INTRAVENOUS | Status: DC

## 2020-05-16 NOTE — Op Note (Signed)
[facility] Patient Name: Diamond Ramirez Procedure Date: 05/16/2020 10:28 AM MRN: [id number] Endoscopist: Rachael Fee , MD Age: 45 Referring MD:  Date of Birth: 1975-12-31 Gender: Female Account #: [id number] Procedure:                Upper GI endoscopy Indications:              Abdominal pain in the right upper quadrant,                            Dyspepsia Medicines:                Monitored Anesthesia Care Procedure:                Pre-Anesthesia Assessment:                           - Prior to the procedure, a History and Physical                            was performed, and patient medications and                            allergies were reviewed. The patient's tolerance of                            previous anesthesia was also reviewed. The risks                            and benefits of the procedure and the sedation                            options and risks were discussed with the patient.                            All questions were answered, and informed consent                            was obtained. Prior Anticoagulants: The patient has                            taken no previous anticoagulant or antiplatelet                            agents. ASA Grade Assessment: II - A patient with                            mild systemic disease. After reviewing the risks                            and benefits, the patient was deemed in                            satisfactory condition to undergo the procedure.  After obtaining informed consent, the endoscope was                            passed under direct vision. Throughout the                            procedure, the patient's blood pressure, pulse, and                            oxygen saturations were monitored continuously. The                            Endoscope was introduced through the mouth, and                            advanced to the second part of duodenum. The upper                             GI endoscopy was accomplished without difficulty.                            The patient tolerated the procedure well. Scope In: Scope Out: Findings:                 The esophagus was normal.                           The stomach was normal.                           The examined duodenum was normal. Complications:            No immediate complications. Estimated blood loss:                            None. Estimated Blood Loss:     Estimated blood loss: none. Impression:               - Normal UGI tract. Recommendation:           - Patient has a contact number available for                            emergencies. The signs and symptoms of potential                            delayed complications were discussed with the                            patient. Return to normal activities tomorrow.                            Written discharge instructions were provided to the                            patient.                           -  Resume previous diet.                           - Continue present medications. Rachael Fee, MD 05/16/2020 10:40:30 AM This report has been signed electronically.

## 2020-05-16 NOTE — Patient Instructions (Signed)
You may resume your current medications today. Please call if any questions or concerns.    YOU HAD AN ENDOSCOPIC PROCEDURE TODAY AT THE [facility]:   Refer to the procedure report that was given to you for any specific questions about what was found during the examination.  If the procedure report does not answer your questions, please call your gastroenterologist to clarify.  If you requested that your care partner not be given the details of your procedure findings, then the procedure report has been included in a sealed envelope for you to review at your convenience later.  YOU SHOULD EXPECT: Some feelings of bloating in the abdomen. Passage of more gas than usual.  Walking can help get rid of the air that was put into your GI tract during the procedure and reduce the bloating. If you had a lower endoscopy (such as a colonoscopy or flexible sigmoidoscopy) you may notice spotting of blood in your stool or on the toilet paper. If you underwent a bowel prep for your procedure, you may not have a normal bowel movement for a few days.  Please Note:  You might notice some irritation and congestion in your nose or some drainage.  This is from the oxygen used during your procedure.  There is no need for concern and it should clear up in a day or so.  SYMPTOMS TO REPORT IMMEDIATELY:    Following upper endoscopy (EGD)  Vomiting of blood or coffee ground material  New chest pain or pain under the shoulder blades  Painful or persistently difficult swallowing  New shortness of breath  Fever of 100F or higher  Black, tarry-looking stools  For urgent or emergent issues, a gastroenterologist can be reached at any hour by calling [phone]. Do not use MyChart messaging for urgent concerns.    DIET:  We do recommend a small meal at first, but then you may proceed to your regular diet.  Drink plenty of fluids but you should avoid alcoholic beverages for 24 hours.  ACTIVITY:  You  should plan to take it easy for the rest of today and you should NOT DRIVE or use heavy machinery until tomorrow (because of the sedation medicines used during the test).    FOLLOW UP: Our staff will call the number listed on your records 48-72 hours following your procedure to check on you and address any questions or concerns that you may have regarding the information given to you following your procedure. If we do not reach you, we will leave a message.  We will attempt to reach you two times.  During this call, we will ask if you have developed any symptoms of COVID 19. If you develop any symptoms (ie: fever, flu-like symptoms, shortness of breath, cough etc.) before then, please call [phone].  If you test positive for Covid 19 in the 2 weeks post procedure, please call and report this information to [place].    If any biopsies were taken you will be contacted by phone or by letter within the next 1-3 weeks.  Please call us at [phone] if you have not heard about the biopsies in 3 weeks.    SIGNATURES/CONFIDENTIALITY: You and/or your care partner have signed paperwork which will be entered into your electronic medical record.  These signatures attest to the fact that that the information above on your After Visit Summary has been reviewed and is understood.  Full responsibility of the confidentiality of this discharge information lies with  you and/or your care-partner.

## 2020-05-16 NOTE — Progress Notes (Addendum)
Pt speaks [place]  Jamelle Haring was her interpreter.  The interpreter was at the bedside while the pt was in the recovery room.  Maw  Pt's son lives with his mother and he speaks [place].  I explained the discharge instructions were in english.  He said he could read them.  I also that we will call thurs am 07:15 - 08:15 to check on his mother.maw  No problems noted in the recovery room. maw

## 2020-05-16 NOTE — Progress Notes (Signed)
Report given to PACU, vss  Robinul 0.1 mg IV given due large amount of secretions upon assessment.  MD made aware, vss 

## 2020-05-16 NOTE — Progress Notes (Signed)
VS take by CW 

## 2020-05-18 ENCOUNTER — Telehealth: Payer: Self-pay

## 2020-05-18 ENCOUNTER — Telehealth: Payer: Self-pay | Admitting: *Deleted

## 2020-05-18 NOTE — Telephone Encounter (Signed)
Attempted to reach patient for post-procedure f/u call. No answer. Left message that we will make another attempt to reach her again later today and for her to please not hesitate to call us if she has any questions/concerns regarding her care. 

## 2020-05-18 NOTE — Telephone Encounter (Signed)
  Follow up Call-  Call back number 05/16/2020  Post procedure Call Back phone  # [phone]  Permission to leave phone message Yes  Some recent data might be hidden     Patient questions:  Message left to call us if necessary.  Second call.

## 2020-05-25 ENCOUNTER — Ambulatory Visit (HOSPITAL_COMMUNITY)
Admission: RE | Admit: 2020-05-25 | Discharge: 2020-05-25 | Disposition: A | Discharge status: Home / Self Care | Payer: BC Managed Care – PPO | Source: Ambulatory Visit | Attending: Family Medicine | Admitting: Family Medicine

## 2020-05-25 ENCOUNTER — Encounter: Payer: Self-pay | Admitting: Family Medicine

## 2020-05-25 ENCOUNTER — Other Ambulatory Visit: Payer: Self-pay

## 2020-05-25 ENCOUNTER — Ambulatory Visit: Payer: BC Managed Care – PPO | Admitting: Family Medicine

## 2020-05-25 ENCOUNTER — Telehealth: Payer: Self-pay

## 2020-05-25 VITALS — BP 104/72 | HR 90 | Wt 117.6 lb

## 2020-05-25 DIAGNOSIS — R1011 Right upper quadrant pain: Secondary | ICD-10-CM | POA: Diagnosis not present

## 2020-05-25 DIAGNOSIS — R079 Chest pain, unspecified: Secondary | ICD-10-CM

## 2020-05-25 MED ORDER — FAMOTIDINE 20 MG PO TABS: 20.0000 mg | ORAL_TABLET | Freq: Two times a day (BID) | ORAL | 3 refills | Status: DC

## 2020-05-25 NOTE — Telephone Encounter (Signed)
I was alerted by Nehemiah Settle to call patient in regards to chest pain and stomach complaints. I called her sister who gave me the patients daughter phone number. I called daughter who reported the patient has been having chest pains for ~ 3 weeks. No other symptoms I could gather from daughter. Daughter stated she was not having any stomach issues at this time, however there was a language barrier. Patient scheduled to come in this morning for a PCP visit.

## 2020-05-25 NOTE — Progress Notes (Unsigned)
  Date of Visit: 05/25/2020   SUBJECTIVE:   HPI:  Zuly presents today for a same day appointment to discuss abdominal and chest pain. [place] video interpreter utilized during this visit.   Pain present for about 3 weeks but worse and constant over the last few days. Has feeling of food stuck in her esophagus. Pain primarily in epigastric/RUQ/R chest area. Has some numbness type feeling in her R back as well. Endorses pain worse with exertion and better with rest. Does endorse some shortness of breath and sweating as well, says "hard to breathe well" when pain is very bad. The pain is significantly worse with eating, and especially after eating fatty foods. Does not smoke. Endorses elderly father with history of chronic chest pain but says nothing was found as the cause for his pain.  Of note was seen by GI 05/02/20 for epigastric and RUQ pain and had EGD performed on 2/15 which was normal. Is taking prilosec 40mg  daily with some relief but not much.  Also endorses feeling some pressure in her head and stiffness in her neck. No fevers. No nausea/vomiting. Also says her heart rate goes up when she stands very quickly.  OBJECTIVE:   BP 104/72   Pulse 90   Wt 117 lb 9.6 oz (53.3 kg)   SpO2 98%   BMI 20.83 kg/m  Gen: no acute distress, pleasant, cooperative HEENT: normocephalic, atraumatic, moist mucous membranes. Full range of motion of neck without meningeal signs. Heart: regular rate and rhythm, no murmur. R chest wall tender to palpation and partially reproduces type of pain patient is experiencing Lungs: clear to auscultation bilaterally, normal work of breathing  Abdomen: normoactive bowel sounds, soft, mild tenderness in upper abdomen. Negative murphy sign.  Neuro: alert, speech normal, grossly nonfocal Ext: No appreciable lower extremity edema bilaterally  Skin: no rashes over back where patient endorses numbness.  ASSESSMENT/PLAN:   Patient presents with RUQ/epigastric and  chest pain.  Chest pain has largely atypical features. Lower suspicion for cardiac cause of pain as it worsens significantly with eating fatty foods and is at least partially reproducible with palpation. Highest on differential at this time is bilary colic. Less concerned for acute cholecystitis given afebrile and negative murphy's sign. Pain worse with exertion and better with rest, but I think this is attributable to possible biliary pain. EKG done today and is normal, as are orthostatics. Patient is overall well appearing and well hydrated, and abdominal exam is benign. Less likely zoster, as no rashes visible on skin.  No cardiac risk factors - patient is nonsmoker, no history of diabetes or hypertension, no strong family history of early onset cardiac disease. With normal EKG and vitals I am reassured today.  Ideally would have repeated labs today (CBC, CMET, lipase), but unable as by the time visit occurred, our lab was already closed.   Will schedule RUQ ultrasound to assess gallbladder - if stones seen plan referral to general surgery.  Also add pepcid 20mg  twice daily to current regimen of prilosec.  Visit scheduled to follow up with me on 3/8. Advised to go to ED if any worsening in the interim.  Patient appreciative and agreeable to this plan.  J. 5/8, MD [facility]

## 2020-05-25 NOTE — Patient Instructions (Signed)
We will call with ultrasound appointment  Start pepcid 20mg  twice daily  Follow up with me on 3/8  If lots of worsening to to ER  Be well, Dr. 5/8

## 2020-05-26 ENCOUNTER — Telehealth: Payer: Self-pay

## 2020-05-26 ENCOUNTER — Other Ambulatory Visit: Payer: Self-pay

## 2020-05-26 ENCOUNTER — Ambulatory Visit (HOSPITAL_COMMUNITY)
Admission: RE | Admit: 2020-05-26 | Discharge: 2020-05-26 | Disposition: A | Discharge status: Home / Self Care | Payer: BC Managed Care – PPO | Source: Ambulatory Visit | Attending: Family Medicine | Admitting: Family Medicine

## 2020-05-26 ENCOUNTER — Emergency Department (HOSPITAL_COMMUNITY): Payer: BC Managed Care – PPO

## 2020-05-26 ENCOUNTER — Encounter (HOSPITAL_COMMUNITY): Payer: Self-pay

## 2020-05-26 ENCOUNTER — Emergency Department (HOSPITAL_COMMUNITY)
Admission: EM | Admit: 2020-05-26 | Discharge: 2020-05-26 | Disposition: A | Discharge status: Home / Self Care | Payer: BC Managed Care – PPO | Attending: Emergency Medicine | Admitting: Emergency Medicine

## 2020-05-26 DIAGNOSIS — Z79899 Other long term (current) drug therapy: Secondary | ICD-10-CM | POA: Insufficient documentation

## 2020-05-26 DIAGNOSIS — R1011 Right upper quadrant pain: Secondary | ICD-10-CM | POA: Diagnosis not present

## 2020-05-26 DIAGNOSIS — R1013 Epigastric pain: Secondary | ICD-10-CM | POA: Diagnosis not present

## 2020-05-26 DIAGNOSIS — R109 Unspecified abdominal pain: Secondary | ICD-10-CM | POA: Diagnosis not present

## 2020-05-26 LAB — CBC WITH DIFFERENTIAL/PLATELET
Abs Immature Granulocytes: 0.01 K/uL (ref 0.00–0.07)
Basophils Absolute: 0 K/uL (ref 0.0–0.1)
Basophils Relative: 0 %
Eosinophils Absolute: 0.3 K/uL (ref 0.0–0.5)
Eosinophils Relative: 5 %
HCT: 38.2 % (ref 36.0–46.0)
Hemoglobin: 12.9 g/dL (ref 12.0–15.0)
Immature Granulocytes: 0 %
Lymphocytes Relative: 36 %
Lymphs Abs: 2.2 K/uL (ref 0.7–4.0)
MCH: 31.5 pg (ref 26.0–34.0)
MCHC: 33.8 g/dL (ref 30.0–36.0)
MCV: 93.4 fL (ref 80.0–100.0)
Monocytes Absolute: 0.5 K/uL (ref 0.1–1.0)
Monocytes Relative: 8 %
Neutro Abs: 3.2 K/uL (ref 1.7–7.7)
Neutrophils Relative %: 51 %
Platelets: 237 K/uL (ref 150–400)
RBC: 4.09 MIL/uL (ref 3.87–5.11)
RDW: 11.2 % — ABNORMAL LOW (ref 11.5–15.5)
WBC: 6.1 K/uL (ref 4.0–10.5)
nRBC: 0 % (ref 0.0–0.2)

## 2020-05-26 LAB — URINALYSIS, ROUTINE W REFLEX MICROSCOPIC
Bilirubin Urine: NEGATIVE
Glucose, UA: NEGATIVE mg/dL
Ketones, ur: NEGATIVE mg/dL
Leukocytes,Ua: NEGATIVE
Nitrite: NEGATIVE
Protein, ur: 30 mg/dL — AB
Specific Gravity, Urine: 1.002 — ABNORMAL LOW (ref 1.005–1.030)
pH: 7 (ref 5.0–8.0)

## 2020-05-26 LAB — COMPREHENSIVE METABOLIC PANEL
ALT: 18 U/L (ref 0–44)
AST: 20 U/L (ref 15–41)
Albumin: 4.6 g/dL (ref 3.5–5.0)
Alkaline Phosphatase: 56 U/L (ref 38–126)
Anion gap: 9 (ref 5–15)
BUN: 10 mg/dL (ref 6–20)
CO2: 26 mmol/L (ref 22–32)
Calcium: 9.2 mg/dL (ref 8.9–10.3)
Chloride: 102 mmol/L (ref 98–111)
Creatinine, Ser: 0.72 mg/dL (ref 0.44–1.00)
GFR, Estimated: >60 mL/min (ref >60)
Glucose, Bld: 132 mg/dL — ABNORMAL HIGH (ref 70–99)
Potassium: 3.5 mmol/L (ref 3.5–5.1)
Sodium: 137 mmol/L (ref 135–145)
Total Bilirubin: 0.6 mg/dL (ref 0.3–1.2)
Total Protein: 7.5 g/dL (ref 6.5–8.1)

## 2020-05-26 LAB — LIPASE, BLOOD: Lipase: 33 U/L (ref 11–51)

## 2020-05-26 LAB — I-STAT BETA HCG BLOOD, ED (MC, WL, AP ONLY): I-stat hCG, quantitative: <5 m[IU]/mL

## 2020-05-26 MED ORDER — ONDANSETRON HCL 4 MG/2ML IJ SOLN
4.0000 mg | Freq: Once | INTRAMUSCULAR | Status: AC
  Administered 2020-05-26: 4 mg via INTRAVENOUS
  Filled 2020-05-26: qty 2

## 2020-05-26 MED ORDER — IOHEXOL 300 MG/ML  SOLN
100.0000 mL | Freq: Once | INTRAMUSCULAR | Status: AC | PRN
  Administered 2020-05-26: 100 mL via INTRAVENOUS

## 2020-05-26 MED ORDER — SODIUM CHLORIDE 0.9 % IV SOLN: INTRAVENOUS | Status: DC

## 2020-05-26 MED ORDER — KETOROLAC TROMETHAMINE 30 MG/ML IJ SOLN
15.0000 mg | Freq: Once | INTRAMUSCULAR | Status: AC
  Administered 2020-05-26: 15 mg via INTRAVENOUS
  Filled 2020-05-26: qty 1

## 2020-05-26 MED ORDER — NAPROXEN 375 MG PO TABS: 375.0000 mg | ORAL_TABLET | Freq: Two times a day (BID) | ORAL | 0 refills | Status: DC

## 2020-05-26 NOTE — Telephone Encounter (Signed)
Patients niece calls nurse line reporting worsening pain. Niece reports she is hurting so bad she feels like she may throw up. No other symptoms, no fever or chills. Advised to go to UC if she is in that much pain. Per PCP note, looks like with worsening pain she was told to seek care. Niece agreed with plan.

## 2020-05-26 NOTE — Telephone Encounter (Signed)
Called patient.  Informed of negative RUQ ultrasound.  She continues to endorse pain in her upper abdomen, right side, as well as now more in the center of her chest. Has eaten some today. Pain is worse today than yesterday.  Since cholecystitis and biliary colic have been effectively ruled out with negative u/s, next most likely item on differential is pancreatitis.  Unfortunately at 5:30 on a Friday I am not able to get labs on her. Given severity of reported pain, recommended going to ED for labs and possible imaging (would do CT abdomen pelvis). Despite normal EKG yesterday and minimal cardiac risk factors, I also would like to get a troponin on her to prove it is normal and entirely rule out cardiac etiology.  Advised going to [facility] ED as they appear to be less busy than [facility] at the moment.  Patient agreeable and appreciative.  Latrelle Dodrill, MD

## 2020-05-26 NOTE — ED Triage Notes (Signed)
Pt reports RLQ abdominal pain x1 year, but over the last few days it has become worse and now radiates to right flank. Pt endorses some N/V and denies diarrhea, hematuria, and pain with urination.

## 2020-05-26 NOTE — ED Provider Notes (Signed)
[facility]-EMERGENCY DEPT Provider Note   CSN: [id number] Arrival date & time: 05/26/20  1815     History Chief Complaint  Patient presents with  . Abdominal Pain    Diamond Ramirez [address] Diamond Ramirez is a 45 y.o. female.  HPI Patient presents with her niece who assists with translating, per her request. The patient is a [place] speaking adult female who is generally well.  She presents today due to worsening abdominal pain.  The pain has been present for about 1 year.  Pain is typically in the right infracostal region radiating from the anterior to the posterior area.  There are occasional episodes of pain in the right superior chest.  She has been seen and evaluated multiple times, had multiple procedures including MRI, endoscopy, ultrasound, labs.  She is currently on Protonix, and discharged famotidine, though her endoscopy from 2 weeks ago was reportedly normal. Over the past 3 to 4 days she has developed worsening pain in the same distribution without any clear precipitating, alleviating or exacerbating factors.  She denies other complaints including fever, diarrhea, lower abdominal pain.  After speaking with her physician she was sent here for further evaluation.    History reviewed. No pertinent past medical history.  Patient Active Problem List   Diagnosis Date Noted  . Hypotension 04/27/2019  . Tenosynovitis of both wrists 04/26/2019  . Generalized abdominal pain 03/11/2019  . Dizziness 12/14/2015  . Breast mass, right 08/31/2015  . Healthcare maintenance 08/31/2015  . 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 TONSILECTOMY, ADENOIDECTOMY, BILATERAL MYRINGOTOMY AND TUBES       OB History   No obstetric history on file.     Family History  Problem Relation Age of Onset  . Hyperlipidemia Mother   . Hypertension Mother   . Liver disease Mother        Elevated liver enzymes; jaundice  . Diabetes Father   .  Hyperlipidemia Father   . Hypertension Father   . Liver disease Brother        Elavated liver enzymes; jaundice  . Liver disease Maternal Grandmother        Elevated liver enzymes  . Colon cancer Neg Hx   . Esophageal cancer Neg Hx   . Pancreatic cancer Neg Hx   . Stomach cancer Neg Hx   . Rectal cancer Neg Hx     Social History   Tobacco Use  . Smoking status: Never Smoker  . Smokeless tobacco: Never Used  Vaping Use  . Vaping Use: Never used  Substance Use Topics  . Alcohol use: No  . Drug use: No    Home Medications Prior to Admission medications   Medication Sig Start Date End Date Taking? Authorizing Provider  famotidine (PEPCID) 20 MG tablet Take 1 tablet (20 mg total) by mouth 2 (two) times daily. 05/25/20   Latrelle Dodrill, MD  omeprazole (PRILOSEC) 40 MG capsule Take 1 capsule (40 mg total) by mouth daily. Take shortly before breakfast meal each day. 05/02/20   Rachael Fee, MD  polyethylene glycol powder [facility]) 17 GM/SCOOP powder Take 17 g by mouth 2 (two) times daily as needed. 04/18/20   Dana Allan, MD    Allergies    Patient has no known allergies.  Review of Systems   Review of Systems  Constitutional:       Per HPI, otherwise negative  HENT:       Per  HPI, otherwise negative  Respiratory:       Per HPI, otherwise negative  Cardiovascular:       Per HPI, otherwise negative  Gastrointestinal: Positive for abdominal pain and nausea. Negative for vomiting.  Endocrine:       Negative aside from HPI  Genitourinary:       Neg aside from HPI   Musculoskeletal:       Per HPI, otherwise negative  Skin: Negative.   Neurological: Negative for syncope.    Physical Exam Updated Vital Signs BP (!) 113/55 (BP Location: Left Arm)   Pulse 79   Temp 98.3 F (36.8 C) (Oral)   Resp 18   SpO2 100%   Physical Exam Vitals and nursing note reviewed.  Constitutional:      General: She is not in acute distress.    Appearance: She is  well-developed and well-nourished.  HENT:     Head: Normocephalic and atraumatic.  Eyes:     Extraocular Movements: EOM normal.     Conjunctiva/sclera: Conjunctivae normal.  Cardiovascular:     Rate and Rhythm: Normal rate and regular rhythm.  Pulmonary:     Effort: Pulmonary effort is normal. No respiratory distress.     Breath sounds: Normal breath sounds. No stridor.  Abdominal:     General: There is no distension.     Tenderness: There is abdominal tenderness in the epigastric area.  Musculoskeletal:        General: No edema.  Skin:    General: Skin is warm and dry.  Neurological:     Mental Status: She is alert and oriented to person, place, and time.     Cranial Nerves: No cranial nerve deficit.  Psychiatric:        Mood and Affect: Mood and affect normal.     ED Results / Procedures / Treatments   Labs (all labs ordered are listed, but only abnormal results are displayed) Labs Reviewed  COMPREHENSIVE METABOLIC PANEL - Abnormal; Notable for the following components:      Result Value   Glucose, Bld 132 (*)    All other components within normal limits  CBC WITH DIFFERENTIAL/PLATELET - Abnormal; Notable for the following components:   RDW 11.2 (*)    All other components within normal limits  URINALYSIS, ROUTINE W REFLEX MICROSCOPIC - Abnormal; Notable for the following components:   Color, Urine PINK (*)    Specific Gravity, Urine 1.002 (*)    Hgb urine dipstick LARGE (*)    Protein, ur 30 (*)    Bacteria, UA RARE (*)    All other components within normal limits  LIPASE, BLOOD  I-STAT BETA HCG BLOOD, ED ([facility], [facility], [facility] ONLY)    EKG None  Radiology CT ABDOMEN PELVIS W CONTRAST  Result Date: 05/26/2020 CLINICAL DATA:  Right lower quadrant abdominal pain. x1 year, but over the last few days it has become worse and now radiates to right flank. Pt endorses some N/V and denies diarrhea, hematuria, and pain with urination EXAM: CT ABDOMEN AND PELVIS WITH CONTRAST  TECHNIQUE: Multidetector CT imaging of the abdomen and pelvis was performed using the standard protocol following bolus administration of intravenous contrast. CONTRAST:  OMNIPAQUE IOHEXOL 300 MG/ML  SOLN COMPARISON:  CT abdomen pelvis 05/10/2019, ultrasound breast 09/08/2017 FINDINGS: Lower chest: Bilateral lower lobe subsegmental atelectasis. No acute abnormality. Hepatobiliary: No focal liver abnormality. No gallstones, gallbladder wall thickening, or pericholecystic fluid. No biliary dilatation. Pancreas: No focal lesion. Normal pancreatic contour. No  surrounding inflammatory changes. No main pancreatic ductal dilatation. Spleen: Normal in size without focal abnormality. Adrenals/Urinary Tract: No adrenal nodule bilaterally. Bilateral kidneys enhance symmetrically. No hydronephrosis. No hydroureter. The urinary bladder is unremarkable. Stomach/Bowel: Stomach is within normal limits. No evidence of bowel wall thickening or dilatation. Appendix appears normal. 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 No abdominal aorta or iliac aneurysm. Mild atherosclerotic plaque of the aorta and its branches. No abdominal, pelvic, or inguinal lymphadenopathy. Reproductive: The uterus is retroverted. Hypodensity within the posterior wall of the uterus could represent a uterine fibroid. Otherwise the uterus and bilateral adnexa are unremarkable. Other: No intraperitoneal free fluid. No intraperitoneal free gas. No organized fluid collection. Musculoskeletal: Similar-appearing oval well-defined density measuring up to 1.3 cm in the right breast. No suspicious lytic or blastic osseous lesions. No acute displaced fracture. IMPRESSION: 1. No acute intraabdominal or intrapelvic abnormality. 2. 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 3. Intramural uterine fibroid. Electronically Signed   By: Tish Frederickson  M.D.   On: 05/26/2020 21:28   US Abdomen Limited RUQ (LIVER/GB)  Result Date: 05/26/2020 CLINICAL DATA:  RIGHT upper quadrant pain for 1 week EXAM: ULTRASOUND ABDOMEN LIMITED RIGHT UPPER QUADRANT COMPARISON:  None FINDINGS: Gallbladder: Normally distended without stones or wall thickening. No pericholecystic fluid or sonographic Murphy sign. Common bile duct: Diameter: 4 mm, normal Liver: Normal echogenicity without mass or nodularity. No intrahepatic biliary dilatation. Portal vein is patent on color Doppler imaging with normal direction of blood flow towards the liver. Other: No RIGHT upper quadrant free fluid. IMPRESSION: Normal exam. Electronically Signed   By: Ulyses Southward M.D.   On: 05/26/2020 08:17    Procedures Procedures   Medications Ordered in ED Medications  0.9 %  sodium chloride infusion ( Intravenous New Bag/Given 05/26/20 1928)  ondansetron (ZOFRAN) injection 4 mg (4 mg Intravenous Given 05/26/20 1927)    ED Course  I have reviewed the triage vital signs and the nursing notes.  Pertinent labs & imaging results that were available during my care of the patient were reviewed by me and considered in my medical decision making (see chart for details).  10:41 PM On repeat exam patient is awake, alert, hemodynamically unremarkable. We discussed findings, generally reassuring.  Urinalysis consistent with ongoing menstrual cycle.  CT scan without acute intra-abdominal processes, no evidence for acute pancreatitis, obstruction, infection. With some evidence for engorgement of the ovarian venous system, and the patient may be experiencing some referred pain. She has a gynecologist with whom she is comfortable following up, and will follow up with her primary care physician as well.   Final Clinical Impression(s) / ED Diagnoses Final diagnoses:  RUQ pain    Rx / DC Orders ED Discharge Orders         Ordered    naproxen (NAPROSYN) 375 MG tablet  2 times daily with meals         05/26/20 2239           Gerhard Munch, MD 05/26/20 2242

## 2020-05-26 NOTE — Discharge Instructions (Signed)
As discussed, today's evaluation has been generally reassuring.  Your blood work did not demonstrate acute changes.  Your CAT scan was mostly reassuring aside from some evidence for inflammation in your reproductive organs.  Please follow-up with your primary care physician and your gynecologist.  For the next week, please take Naprosyn, 375mg , twice daily with food.  Your CT results from today are included below:  IMPRESSION: 1. No acute intraabdominal or intrapelvic abnormality. 2. 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 3. Intramural uterine fibroid.  Nh? ? th?o lu?n, ?nh gi ngy hm nay nhn chung ? khi?n b?n yn tm. Cng vi?c mu c?a b?n khng cho th?y nh?ng thay ??i c?p tnh. Qu trnh qut CAT c?a b?n h?u nh? ? khi?n b?n yn tm ngoi m?t s? b?ng ch?ng v? tnh tr?ng vim nhi?m trong c? quan sinh s?n c?a b?n. Vui lng theo di v?i bc s? ch?m [place] chnh v bc s? ph? khoa c?a b?n. Trong tu?n ti?p theo, vui lng u?ng Naprosyn, 375mg , hai l?n m?i ngy v?i th?c ?n.  K?t qu? CT c?a b?n t? hm nay ???c bao g?m bn d??i:  ?N T??NG: 1. Khng c b?t th??ng c?p tnh trong ? b?ng ho?c trong ch?u. 2. Phnh h? th?ng t?nh m?ch bu?ng tr?ng tri, c th? ph?i bn c?ng nh? ? m?c ?? th?p h?n. 3. Nhn x? t? cung trong.

## 2020-06-06 ENCOUNTER — Other Ambulatory Visit: Payer: Self-pay

## 2020-06-06 ENCOUNTER — Ambulatory Visit (INDEPENDENT_AMBULATORY_CARE_PROVIDER_SITE_OTHER): Payer: BC Managed Care – PPO | Admitting: Family Medicine

## 2020-06-06 VITALS — BP 100/70 | HR 83 | Ht 64.25 in | Wt 116.0 lb

## 2020-06-06 DIAGNOSIS — R238 Other skin changes: Secondary | ICD-10-CM | POA: Diagnosis not present

## 2020-06-06 DIAGNOSIS — R1084 Generalized abdominal pain: Secondary | ICD-10-CM | POA: Diagnosis not present

## 2020-06-06 DIAGNOSIS — Z1211 Encounter for screening for malignant neoplasm of colon: Secondary | ICD-10-CM | POA: Diagnosis not present

## 2020-06-06 DIAGNOSIS — A530 Latent syphilis, unspecified as early or late: Secondary | ICD-10-CM

## 2020-06-06 NOTE — Progress Notes (Unsigned)
  Date of Visit: 06/06/2020   SUBJECTIVE:   HPI:  Diamond Ramirez presents today for follow up of abdominal pain. [place] interpreter utilized during this visit.   Seen in ED after last visit with me, had CT scan which showed engorgement of L ovarian venous system. She since saw OBGYN Dr. Ambrose Mantle who thinks she may need operative intervention, and that the upper abdominal pain she is experiencing may be referred pain from her ovaries. She has an appointment with GYN surgeon Dr. Ellyn Hack in 2 days. She has been taking naproxen and ibuprofen (not at the same time) with improvement in pain.  History of syphilis - RPR checked in January at [facility] office and titer was 1:2.  Papule of face - has noticed it on R cheek. Wants it removed. It itches sometimes.   OBJECTIVE:   BP 100/70   Pulse 83   Ht 5' 4.25" (1.632 m)   Wt 116 lb (52.6 kg)   SpO2 100%   BMI 19.76 kg/m  Gen: no acute distress, pleasant cooperative HEENT: normocephalic, atraumatic  Heart: regular rate and rhythm, no murmur Lungs: clear to auscultation bilaterally, normal work of breathing  Neuro: alert, speech normal, grossly nonfocal Abdomen: soft, nontender to palpation, no masses. Normoactive bowel sounds  Ext: No appreciable lower extremity edema bilaterally  Skin: flesh colored 40mm papule of R face/cheek in nasolabial fold  ASSESSMENT/PLAN:   Health maintenance:  -refer to GI for screening colonoscopy -has already received flu shot  Generalized abdominal pain Has had fairly extensive workup without any identifiable cause of her pain other than the prominent engorgement of venous system in pelvis. There is thought this may be causing referred pain. Next step is to meet with GYN surgeon. I have no further advice to add; I am curious to know whether GYN thinks surgery will be helpful. In the meantime continue NSAIDs as they are helping; reiterated not to take both ibuprofen and naproxen together.  Will refer back to GI for  screening colonoscopy as she is 44.  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 Continue to monitor annually unless becomes nonreactive.  Face papule Patient requesting removal. Given location and potential for poor cosmetic outcome recommend she sees dermatology for this. Referral placed.  FOLLOW UP: Referring to derm and GI  [place] J. Pollie Meyer, MD [facility]

## 2020-06-06 NOTE — Patient Instructions (Signed)
It was great to see you again today!  Next step is to see Dr. Ellyn Hack and see what she says about your pain  Don't take both ibuprofen and naproxen together  Referring for colonoscopy  Also referring to dermatology for the spot on your face  Be well, Dr. Pollie Meyer

## 2020-06-07 NOTE — Assessment & Plan Note (Signed)
RPR with appropriate titer 1:2 in January. Continue to monitor annually unless becomes nonreactive.

## 2020-06-07 NOTE — Assessment & Plan Note (Addendum)
Has had fairly extensive workup without any identifiable cause of her pain other than the prominent engorgement of venous system in pelvis. There is thought this may be causing referred pain. Next step is to meet with GYN surgeon. I have no further advice to add; I am curious to know whether GYN thinks surgery will be helpful. In the meantime continue NSAIDs as they are helping; reiterated not to take both ibuprofen and naproxen together.  Will refer back to GI for screening colonoscopy as she is 44.

## 2020-06-08 DIAGNOSIS — N9489 Other specified conditions associated with female genital organs and menstrual cycle: Secondary | ICD-10-CM | POA: Diagnosis not present

## 2020-06-08 DIAGNOSIS — N959 Unspecified menopausal and perimenopausal disorder: Secondary | ICD-10-CM | POA: Diagnosis not present

## 2020-08-08 DIAGNOSIS — N9489 Other specified conditions associated with female genital organs and menstrual cycle: Secondary | ICD-10-CM | POA: Diagnosis not present

## 2020-08-08 DIAGNOSIS — Z8739 Personal history of other diseases of the musculoskeletal system and connective tissue: Secondary | ICD-10-CM | POA: Diagnosis not present

## 2020-08-08 DIAGNOSIS — R5383 Other fatigue: Secondary | ICD-10-CM | POA: Diagnosis not present

## 2020-08-08 DIAGNOSIS — R61 Generalized hyperhidrosis: Secondary | ICD-10-CM | POA: Diagnosis not present

## 2020-08-15 ENCOUNTER — Other Ambulatory Visit: Payer: Self-pay

## 2020-08-15 ENCOUNTER — Ambulatory Visit (AMBULATORY_SURGERY_CENTER): Payer: Self-pay

## 2020-08-15 VITALS — Ht 64.25 in | Wt 118.0 lb

## 2020-08-15 DIAGNOSIS — Z1211 Encounter for screening for malignant neoplasm of colon: Secondary | ICD-10-CM

## 2020-08-15 NOTE — Progress Notes (Signed)
No egg or soy allergy known to patient  No issues with past sedation with any surgeries or procedures Patient denies ever being told they had issues or difficulty with intubation  No FH of Malignant Hyperthermia No diet pills per patient No home 02 use per patient  No blood thinners per patient  Pt denies issues with constipation  No A fib or A flutter  EMMI video to pt or via MyChart  COVID 19 guidelines implemented in [facility] today with Pt and RN  Pt is fully vaccinated  for Covid   Due to the COVID-19 pandemic we are asking patients to follow certain guidelines.  Pt aware of COVID protocols and [facility] guidelines   Interpreter used today at the [facility] for this pt.  Interpreter's name is-H'Tuyet Rahlan  Prep instructions provided in [place] and English per patient request so her children may assist her if necessary.

## 2020-08-24 ENCOUNTER — Encounter: Payer: Self-pay | Admitting: Gastroenterology

## 2020-08-26 ENCOUNTER — Other Ambulatory Visit: Payer: Self-pay | Admitting: Gastroenterology

## 2020-09-01 ENCOUNTER — Other Ambulatory Visit: Payer: Self-pay

## 2020-09-01 ENCOUNTER — Ambulatory Visit (AMBULATORY_SURGERY_CENTER): Payer: BC Managed Care – PPO | Admitting: Gastroenterology

## 2020-09-01 ENCOUNTER — Encounter: Payer: Self-pay | Admitting: Gastroenterology

## 2020-09-01 VITALS — BP 92/42 | HR 70 | Temp 97.5°F | Resp 14 | Ht 64.0 in | Wt 118.0 lb

## 2020-09-01 DIAGNOSIS — Z1211 Encounter for screening for malignant neoplasm of colon: Secondary | ICD-10-CM

## 2020-09-01 MED ORDER — SODIUM CHLORIDE 0.9 % IV SOLN: 500.0000 mL | Freq: Once | INTRAVENOUS | Status: DC

## 2020-09-01 NOTE — Progress Notes (Signed)
Pt speaks [place].  Interpreter is [place].

## 2020-09-01 NOTE — Patient Instructions (Signed)
YOU HAD AN ENDOSCOPIC PROCEDURE TODAY AT THE [facility]:   Refer to the procedure report that was given to you for any specific questions about what was found during the examination.  If the procedure report does not answer your questions, please call your gastroenterologist to clarify.  If you requested that your care partner not be given the details of your procedure findings, then the procedure report has been included in a sealed envelope for you to review at your convenience later.  YOU SHOULD EXPECT: Some feelings of bloating in the abdomen. Passage of more gas than usual.  Walking can help get rid of the air that was put into your GI tract during the procedure and reduce the bloating. If you had a lower endoscopy (such as a colonoscopy or flexible sigmoidoscopy) you may notice spotting of blood in your stool or on the toilet paper. If you underwent a bowel prep for your procedure, you may not have a normal bowel movement for a few days.  Please Note:  You might notice some irritation and congestion in your nose or some drainage.  This is from the oxygen used during your procedure.  There is no need for concern and it should clear up in a day or so.  SYMPTOMS TO REPORT IMMEDIATELY:   Following lower endoscopy (colonoscopy or flexible sigmoidoscopy):  Excessive amounts of blood in the stool  Significant tenderness or worsening of abdominal pains  Swelling of the abdomen that is new, acute  Fever of 100F or higher   Following upper endoscopy (EGD)  Vomiting of blood or coffee ground material  New chest pain or pain under the shoulder blades  Painful or persistently difficult swallowing  New shortness of breath  Fever of 100F or higher  Black, tarry-looking stools  For urgent or emergent issues, a gastroenterologist can be reached at any hour by calling [phone]. Do not use MyChart messaging for urgent concerns.    DIET:  We do recommend a small meal at first, but  then you may proceed to your regular diet.  Drink plenty of fluids but you should avoid alcoholic beverages for 24 hours.  ACTIVITY:  You should plan to take it easy for the rest of today and you should NOT DRIVE or use heavy machinery until tomorrow (because of the sedation medicines used during the test).    FOLLOW UP: Our staff will call the number listed on your records 48-72 hours following your procedure to check on you and address any questions or concerns that you may have regarding the information given to you following your procedure. If we do not reach you, we will leave a message.  We will attempt to reach you two times.  During this call, we will ask if you have developed any symptoms of COVID 19. If you develop any symptoms (ie: fever, flu-like symptoms, shortness of breath, cough etc.) before then, please call [phone].  If you test positive for Covid 19 in the 2 weeks post procedure, please call and report this information to us.    If any biopsies were taken you will be contacted by phone or by letter within the next 1-3 weeks.  Please call us at [phone] if you have not heard about the biopsies in 3 weeks.    SIGNATURES/CONFIDENTIALITY: You and/or your care partner have signed paperwork which will be entered into your electronic medical record.  These signatures attest to the fact that that the information above on   your After Visit Summary has been reviewed and is understood.  Full responsibility of the confidentiality of this discharge information lies with you and/or your care-partner. 

## 2020-09-01 NOTE — Op Note (Addendum)
[facility] Patient Name: Diamond Ramirez Procedure Date: 09/01/2020 2:31 PM MRN: [id number] Endoscopist: Rachael Fee , MD Age: 45 Referring MD:  Date of Birth: [birth year] Gender: Female Account #: [id number] Procedure:                Colonoscopy Indications:              Screening for colorectal malignant neoplasm Medicines:                Monitored Anesthesia Care Procedure:                Pre-Anesthesia Assessment:                           - Prior to the procedure, a History and Physical                            was performed, and patient medications and                            allergies were reviewed. The patient's tolerance of                            previous anesthesia was also reviewed. The risks                            and benefits of the procedure and the sedation                            options and risks were discussed with the patient.                            All questions were answered, and informed consent                            was obtained. Prior Anticoagulants: The patient has                            taken no previous anticoagulant or antiplatelet                            agents. ASA Grade Assessment: II - A patient with                            mild systemic disease. After reviewing the risks                            and benefits, the patient was deemed in                            satisfactory condition to undergo the procedure.                           After obtaining informed consent, the colonoscope  was passed under direct vision. Throughout the                            procedure, the patient's blood pressure, pulse, and                            oxygen saturations were monitored continuously. The                            Olympus CF-HQ190 [id number]) Colonoscope was                            introduced through the anus and advanced to the the                            cecum, identified by  appendiceal orifice and                            ileocecal valve. The colonoscopy was performed                            without difficulty. The patient tolerated the                            procedure well. The quality of the bowel                            preparation was good. The ileocecal valve,                            appendiceal orifice, and rectum were photographed. Scope In: 2:37:05 PM Scope Out: 2:46:45 PM Scope Withdrawal Time: 0 hours 6 minutes 4 seconds  Total Procedure Duration: 0 hours 9 minutes 40 seconds  Findings:                 The entire examined colon appeared normal on direct                            and retroflexion views. Complications:            No immediate complications. Estimated blood loss:                            None. Estimated Blood Loss:     Estimated blood loss: none. Impression:               - The entire examined colon is normal on direct and                            retroflexion views.                           - No polyps or cancers. Recommendation:           - Patient has a contact number available for  emergencies. The signs and symptoms of potential                            delayed complications were discussed with the                            patient. Return to normal activities tomorrow.                            Written discharge instructions were provided to the                            patient.                           - Resume previous diet.                           - Continue present medications.                           - Repeat colonoscopy in 10 years for screening. Rachael Fee, MD 09/01/2020 2:49:03 PM This report has been signed electronically.

## 2020-09-01 NOTE — Progress Notes (Signed)
Report to PACU, RN, vss, BBS= Clear.  

## 2020-09-05 ENCOUNTER — Telehealth: Payer: Self-pay | Admitting: *Deleted

## 2020-09-05 NOTE — Telephone Encounter (Signed)
  Follow up Call-  Call back number 09/01/2020 05/16/2020  Post procedure Call Back phone  # [phone]  Permission to leave phone message Yes Yes  Some recent data might be hidden     No answer at 2nd attempt follow up phone call.  Left message on voicemail.

## 2020-09-05 NOTE — Telephone Encounter (Signed)
Attempted f/u phone call. No answer. Left message. °

## 2020-09-13 IMAGING — CT CT ABD-PELV W/ CM
1 of 3 series · 14 of 32 positions shown, 19 images · IV contrast (APPLIED)
Comparison: None.

CLINICAL DATA: Right upper quadrant pain and tenderness for 4
months. Constipation.

EXAM:
CT ABDOMEN AND PELVIS WITH CONTRAST
TECHNIQUE: Multidetector CT imaging of the abdomen and pelvis was performed
using the standard protocol following bolus administration of
intravenous contrast.
CONTRAST:  100mL [id number]-[id number] IOPAMIDOL ([id number]) INJECTION 61%

[Series 2: abd/pelvis w/cm · axial · 0.64mm/px · z∈[-371,+14]mm · 14 of 89 slices shown, 19 images]
[im 6/89  soft-tissue]
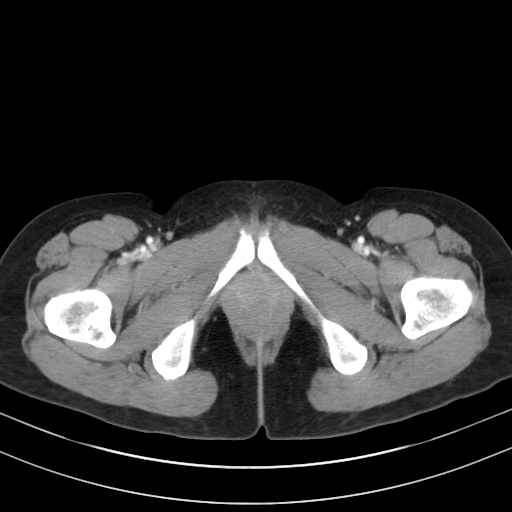
[im 6/89  bone]
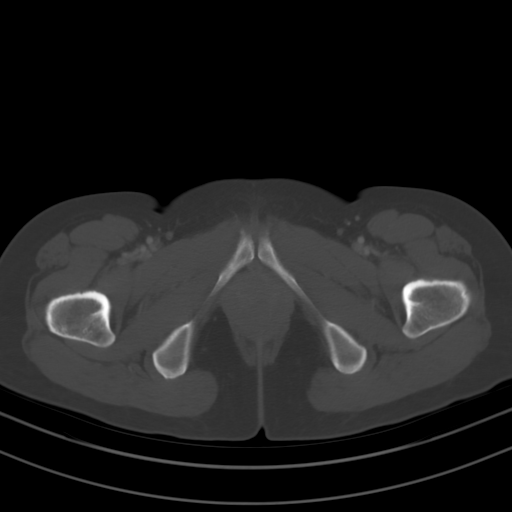
[im 11/89  soft-tissue]
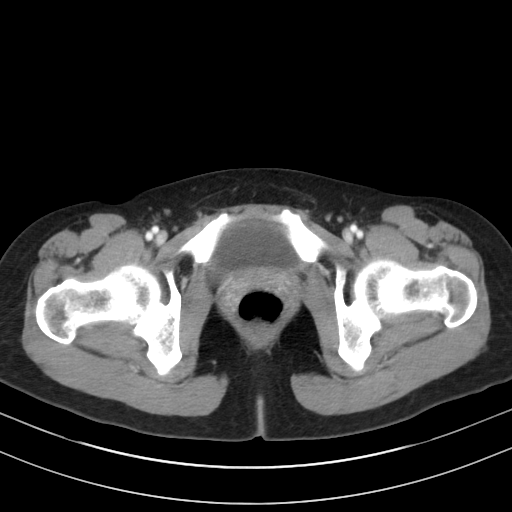
[im 21/89  soft-tissue]
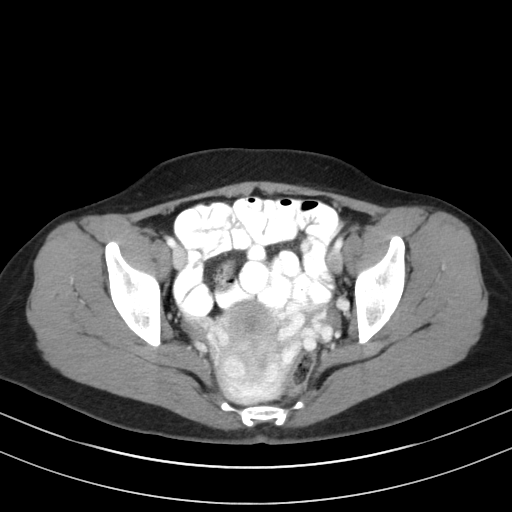
[im 26/89  soft-tissue]
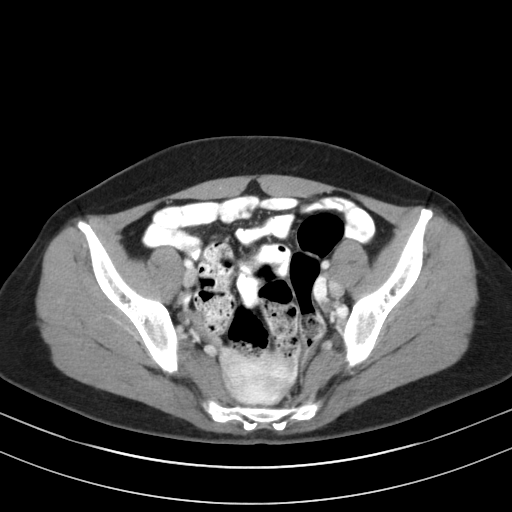
[im 32/89  soft-tissue]
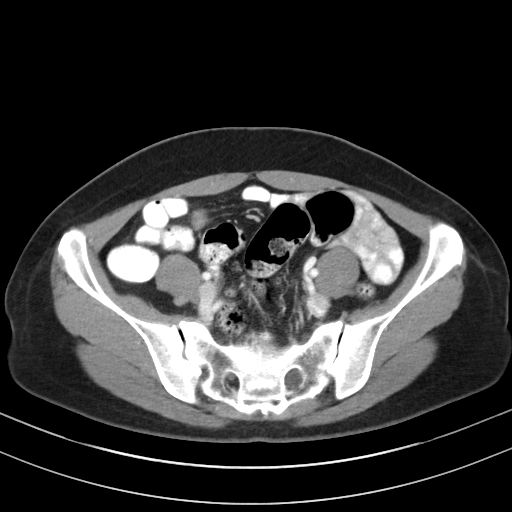
[im 37/89  soft-tissue]
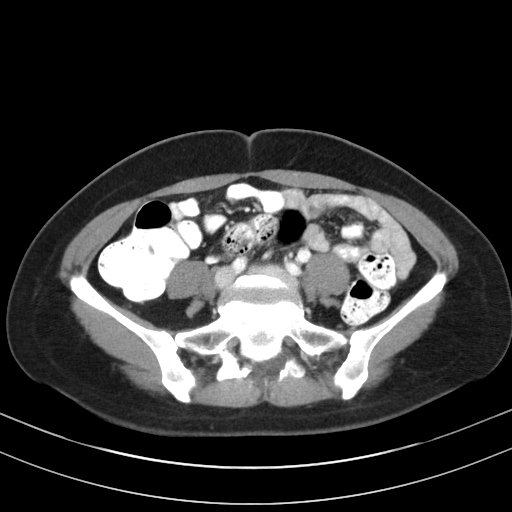
[im 47/89  soft-tissue]
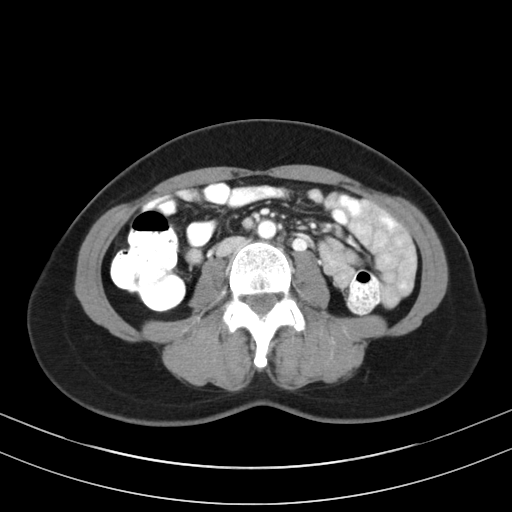
[im 52/89  soft-tissue]
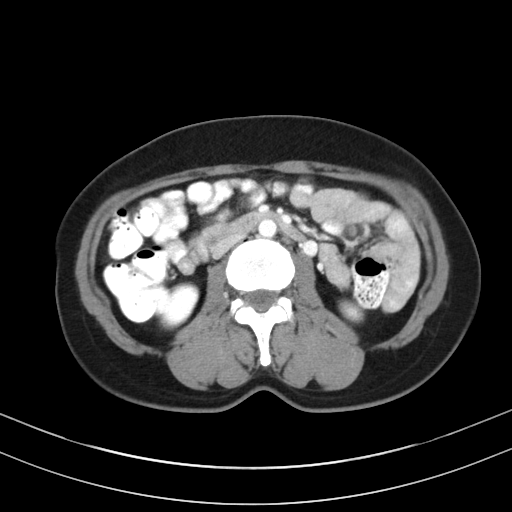
[im 57/89  soft-tissue]
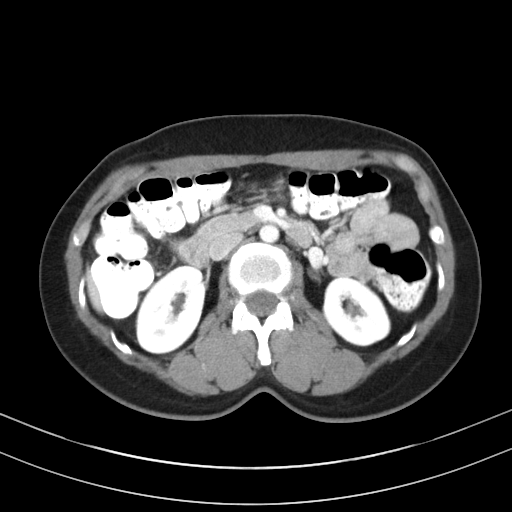
[im 57/89  bone]
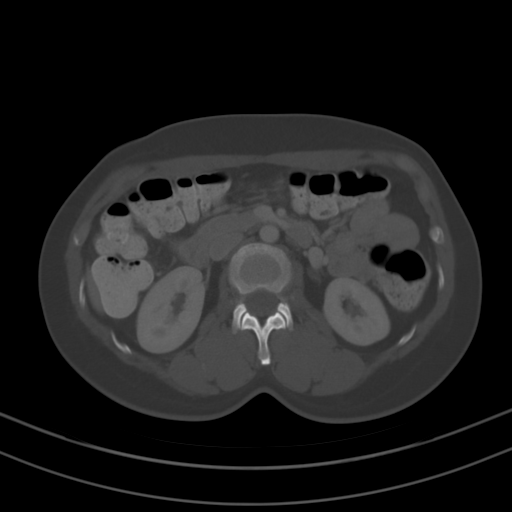
[im 63/89  soft-tissue]
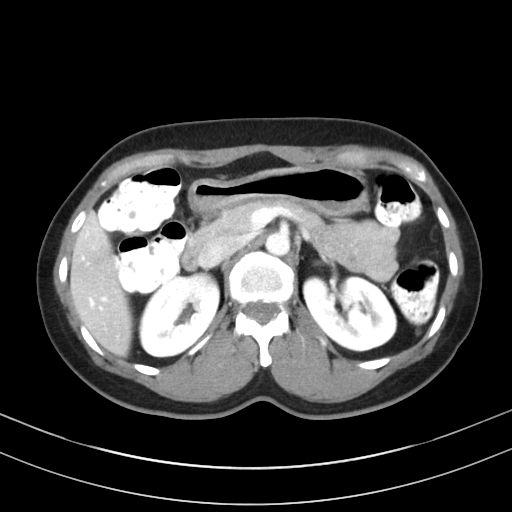
[im 68/89  soft-tissue]
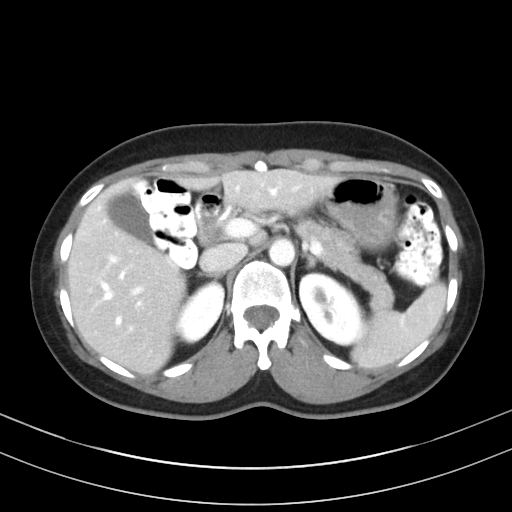
[im 68/89  lung]
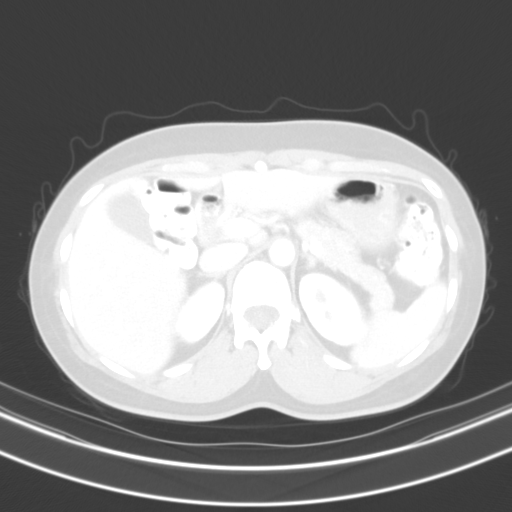
[im 73/89  lung]
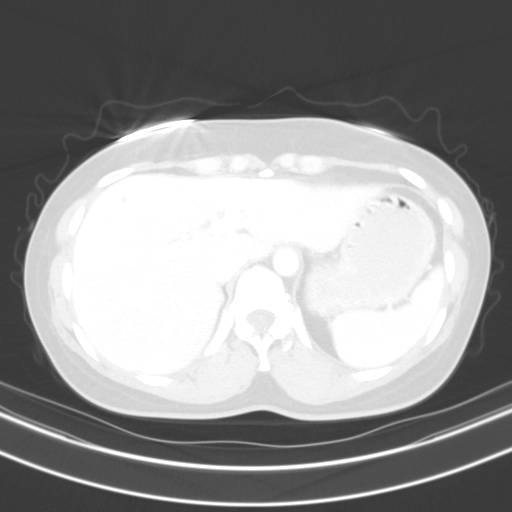
[im 78/89  soft-tissue]
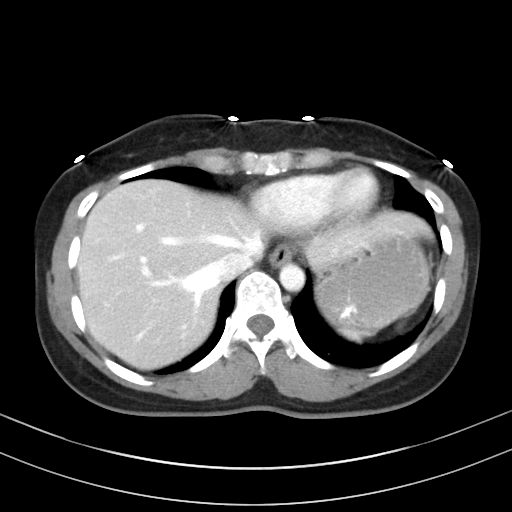
[im 78/89  lung]
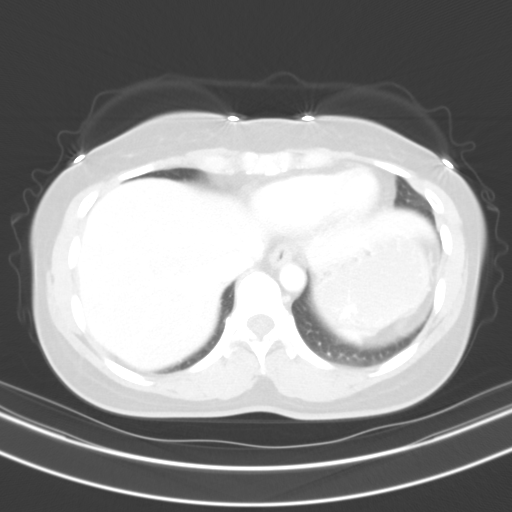
[im 83/89  soft-tissue]
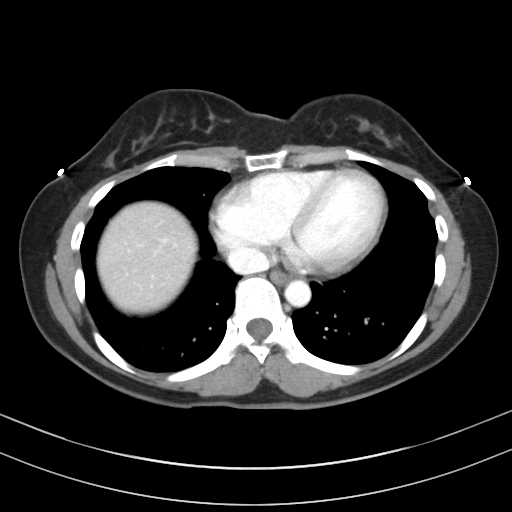
[im 83/89  lung]
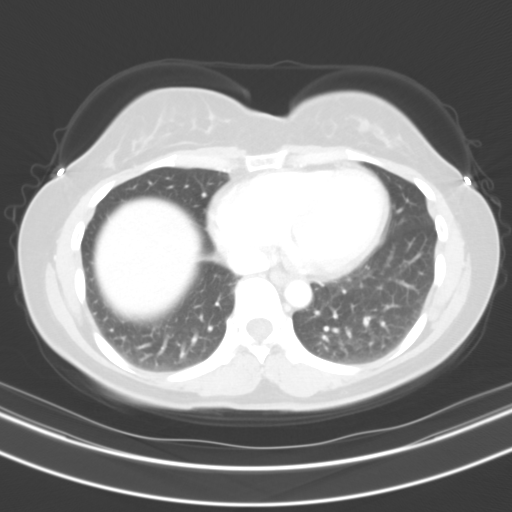

[14 of 32 positions shown; findings below may reference images not displayed]

FINDINGS: Lower Chest: No acute findings.

Hepatobiliary: No hepatic masses identified. Tiny sub-cm cyst noted
near junction of right and left lobes. Gallbladder is unremarkable.
No evidence of biliary ductal dilatation.

Pancreas:  No mass or inflammatory changes.

Spleen: Within normal limits in size and appearance.

Adrenals/Urinary Tract: No masses identified. No evidence of
hydronephrosis.

Stomach/Bowel: No evidence of obstruction, inflammatory process or
abnormal fluid collections. Normal appendix visualized.

Vascular/Lymphatic: No pathologically enlarged lymph nodes. No
abdominal aortic aneurysm.

Reproductive: Retroverted uterus. 1.5 cm fibroid in right posterior
corpus. Adnexal regions are unremarkable.

Other:  None.

Musculoskeletal:  No suspicious bone lesions identified.
IMPRESSION: 1. No acute findings within the abdomen or pelvis.
[DATE] cm uterine fibroid.

## 2020-09-24 ENCOUNTER — Other Ambulatory Visit: Payer: Self-pay | Admitting: Family Medicine

## 2020-09-25 DIAGNOSIS — H43393 Other vitreous opacities, bilateral: Secondary | ICD-10-CM | POA: Diagnosis not present

## 2020-09-25 DIAGNOSIS — H40033 Anatomical narrow angle, bilateral: Secondary | ICD-10-CM | POA: Diagnosis not present

## 2020-11-14 ENCOUNTER — Ambulatory Visit: Payer: BC Managed Care – PPO | Admitting: Dermatology

## 2020-11-14 ENCOUNTER — Other Ambulatory Visit: Payer: Self-pay

## 2020-11-14 ENCOUNTER — Encounter: Payer: Self-pay | Admitting: Dermatology

## 2020-11-14 DIAGNOSIS — L738 Other specified follicular disorders: Secondary | ICD-10-CM | POA: Diagnosis not present

## 2020-11-25 ENCOUNTER — Encounter: Payer: Self-pay | Admitting: Dermatology

## 2020-11-25 NOTE — Progress Notes (Signed)
   New Patient   Subjective  Mailani [address] [place] is a 45 y.o. female who presents for the following: New Patient (Initial Visit) (Right cheek x years itch sometimes, per patient it was previously treated in [place] by laser).  Growth right cheek, sometimes itches Location:  Duration:  Quality:  Associated Signs/Symptoms: Modifying Factors:  Severity:  Timing: Context:    The following portions of the chart were reviewed this encounter and updated as appropriate:  Tobacco  Allergies  Meds  Problems  Med Hx  Surg Hx  Fam Hx      Objective  Well appearing patient in no apparent distress; mood and affect are within normal limits. Right Melolabial Fold Even with an interpreter, history was somewhat challenging.  Apparently the right inner cheek lesion had been treated in [place] with some type of noncutting procedure, perhaps laser and either never improved or grew back.  She apparently was seeking the same therapy in [place].  The lesion is a 4 mm domed papule; dermoscopy supports sebaceous hyperplasia.  I explained through the interpreter 3 times the options of leaving this if it is stable, doing a shave excision (low risk of scar but at least partial recurrence is common), and complete excision (which I recommended be done by plastic surgeon).    A focused examination was performed including head and neck.. Relevant physical exam findings are noted in the Assessment and Plan.   Assessment & Plan  Sebaceous hyperplasia Right Melolabial Fold  Patient may have previously had done and ultimately decided to leave the small papule be.  I emphasized after she stated this that if there were to be clinical change I be happy to see her again.

## 2020-11-28 ENCOUNTER — Other Ambulatory Visit: Payer: Self-pay | Admitting: Obstetrics and Gynecology

## 2020-11-28 DIAGNOSIS — Z1231 Encounter for screening mammogram for malignant neoplasm of breast: Secondary | ICD-10-CM

## 2020-12-05 DIAGNOSIS — E559 Vitamin D deficiency, unspecified: Secondary | ICD-10-CM | POA: Diagnosis not present

## 2020-12-19 ENCOUNTER — Ambulatory Visit
Admission: RE | Admit: 2020-12-19 | Discharge: 2020-12-19 | Disposition: A | Discharge status: Home / Self Care | Payer: BC Managed Care – PPO | Source: Ambulatory Visit | Attending: Obstetrics and Gynecology | Admitting: Obstetrics and Gynecology

## 2020-12-19 ENCOUNTER — Other Ambulatory Visit: Payer: Self-pay

## 2020-12-19 DIAGNOSIS — Z1231 Encounter for screening mammogram for malignant neoplasm of breast: Secondary | ICD-10-CM

## 2021-02-13 ENCOUNTER — Other Ambulatory Visit: Payer: Self-pay | Admitting: Family Medicine

## 2021-02-13 ENCOUNTER — Other Ambulatory Visit: Payer: Self-pay | Admitting: Gastroenterology

## 2021-03-28 DIAGNOSIS — A530 Latent syphilis, unspecified as early or late: Secondary | ICD-10-CM | POA: Diagnosis not present

## 2021-03-28 DIAGNOSIS — R5383 Other fatigue: Secondary | ICD-10-CM | POA: Diagnosis not present

## 2021-03-28 DIAGNOSIS — N951 Menopausal and female climacteric states: Secondary | ICD-10-CM | POA: Diagnosis not present

## 2021-03-28 DIAGNOSIS — N9489 Other specified conditions associated with female genital organs and menstrual cycle: Secondary | ICD-10-CM | POA: Diagnosis not present

## 2021-04-10 DIAGNOSIS — Z1389 Encounter for screening for other disorder: Secondary | ICD-10-CM | POA: Diagnosis not present

## 2021-04-10 DIAGNOSIS — Z113 Encounter for screening for infections with a predominantly sexual mode of transmission: Secondary | ICD-10-CM | POA: Diagnosis not present

## 2021-04-10 DIAGNOSIS — Z6820 Body mass index (BMI) 20.0-20.9, adult: Secondary | ICD-10-CM | POA: Diagnosis not present

## 2021-04-10 DIAGNOSIS — Z01419 Encounter for gynecological examination (general) (routine) without abnormal findings: Secondary | ICD-10-CM | POA: Diagnosis not present

## 2021-04-10 DIAGNOSIS — Z130 Encounter for screening for diseases of the blood and blood-forming organs and certain disorders involving the immune mechanism: Secondary | ICD-10-CM | POA: Diagnosis not present

## 2021-09-30 IMAGING — US US ABDOMEN LIMITED RUQ/ASCITES
1 series · 14 of 25 positions shown · non-contrast
Comparison: None

CLINICAL DATA: RIGHT upper quadrant pain for 1 week

EXAM:
ULTRASOUND ABDOMEN LIMITED RIGHT UPPER QUADRANT

[Series 1: us abdomen limited ruq (liver/gb) · 14 of 38 slices shown]
[im 1/38]
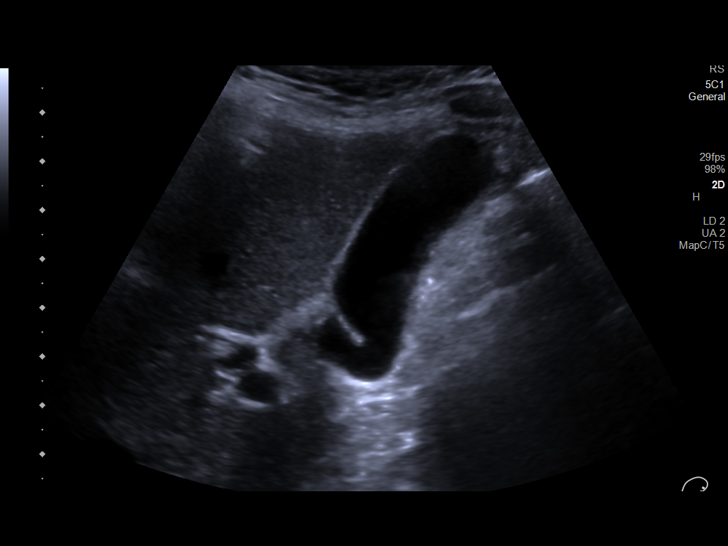
[im 4/38]
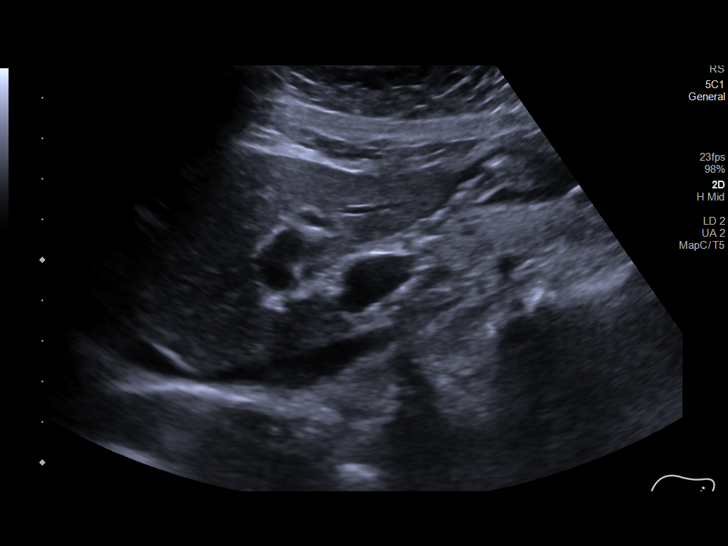
[im 7/38]
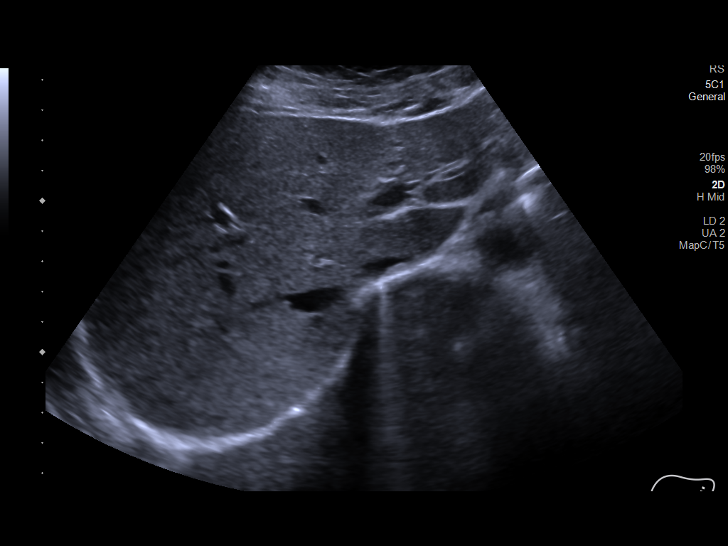
[im 10/38]
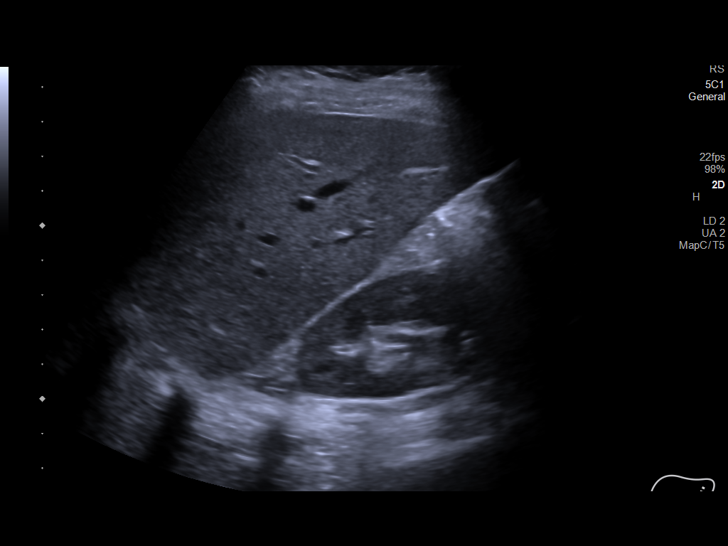
[im 13/38]
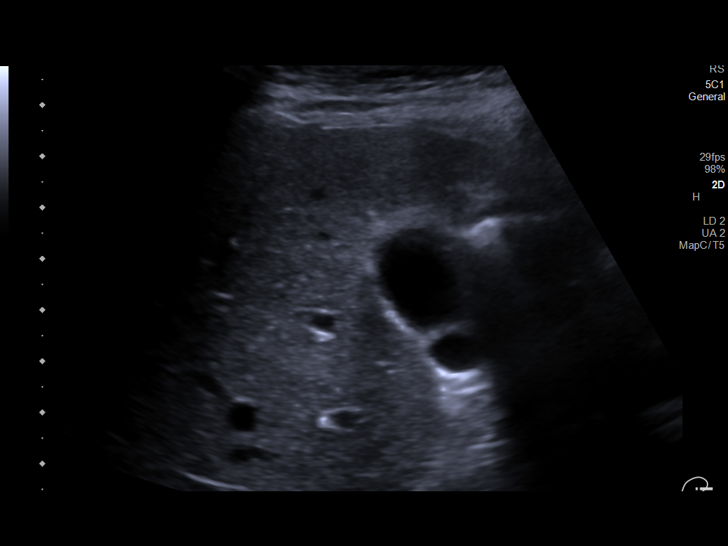
[im 14/38]
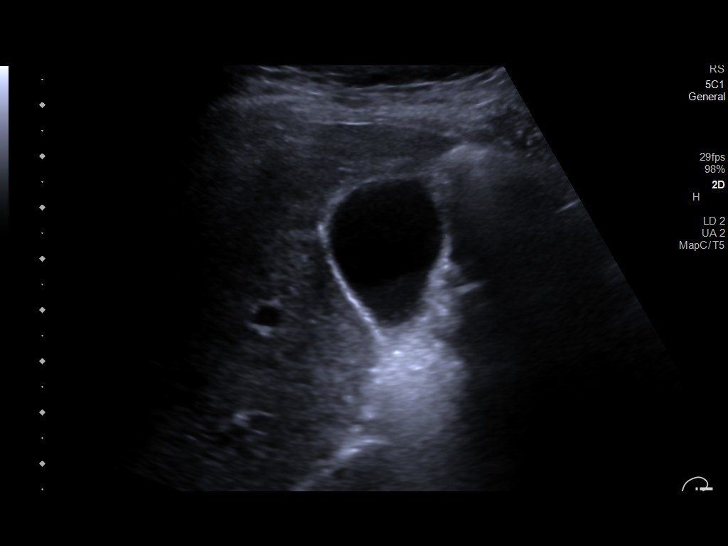
[im 17/38]
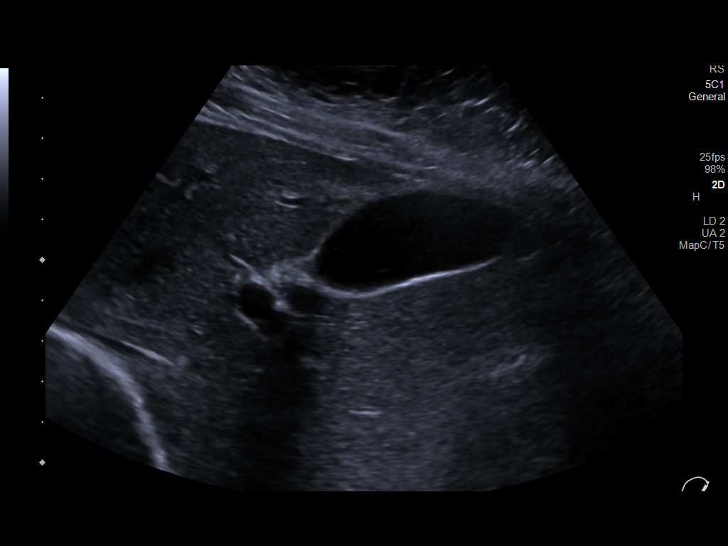
[im 21/38]
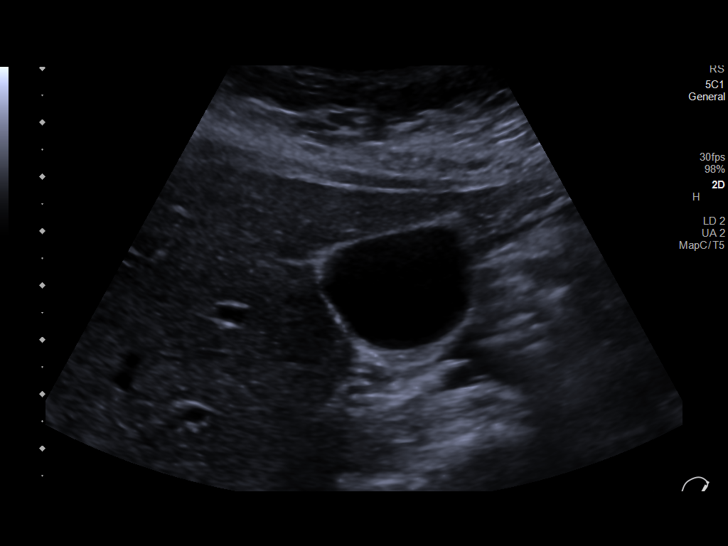
[im 24/38]
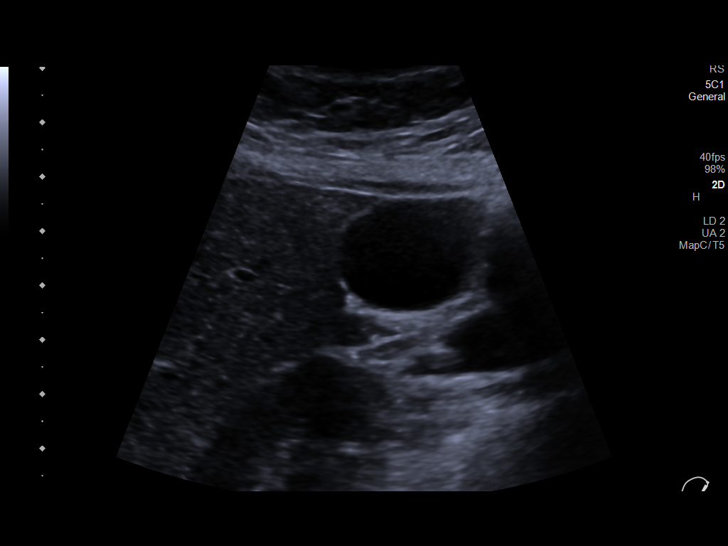
[im 25/38]
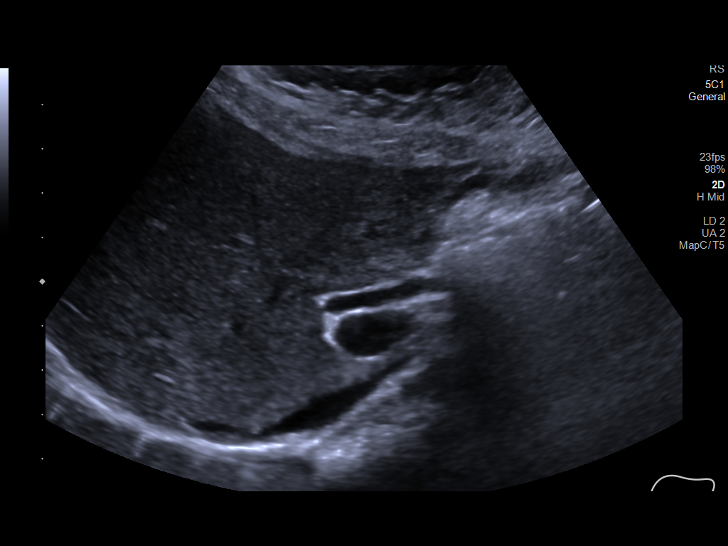
[im 28/38]
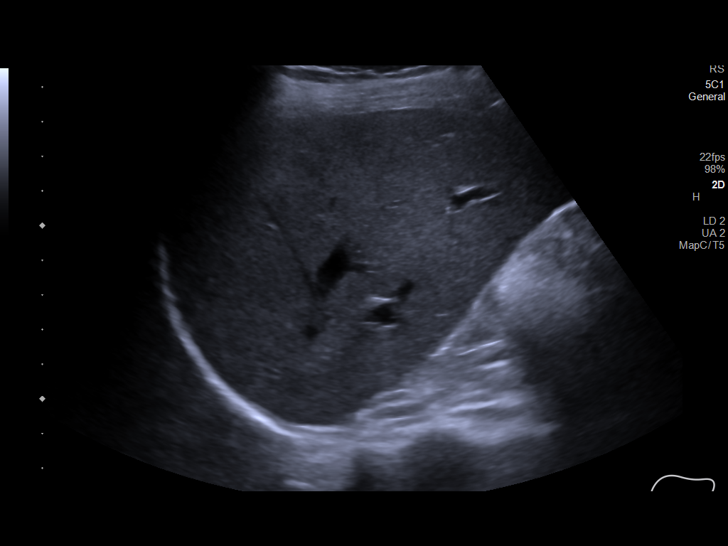
[im 31/38]
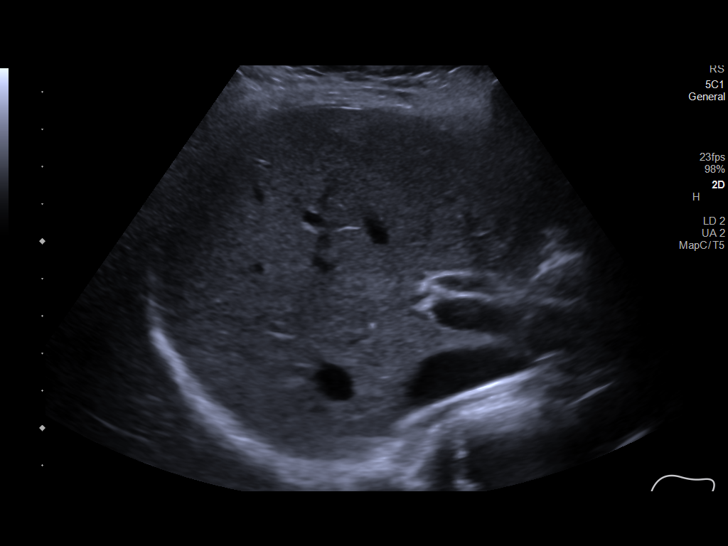
[im 34/38]
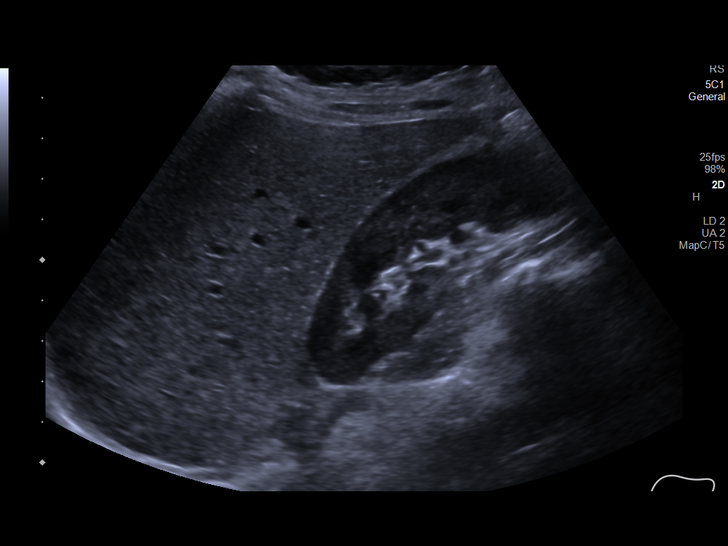
[im 38/38]
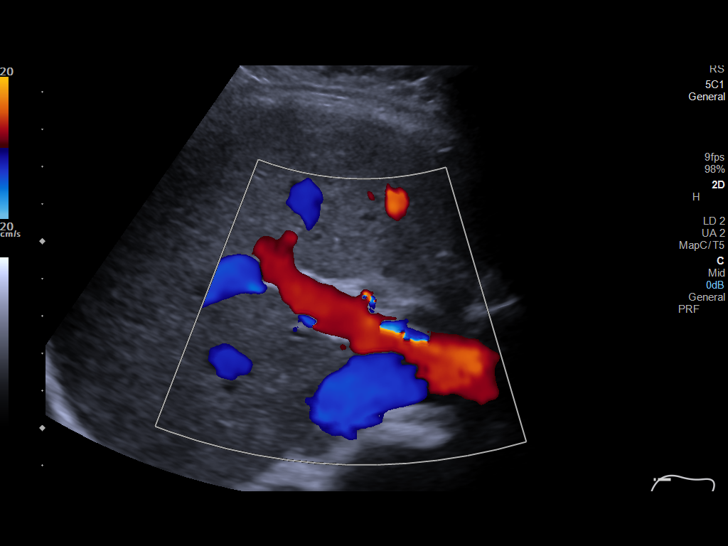

[14 of 25 positions shown; findings below may reference images not displayed]

FINDINGS: Gallbladder:

Normally distended without stones or wall thickening. No
pericholecystic fluid or sonographic Murphy sign.

Common bile duct:

Diameter: 4 mm, normal

Liver:

Normal echogenicity without mass or nodularity. No intrahepatic
biliary dilatation. Portal vein is patent on color Doppler imaging
with normal direction of blood flow towards the liver.

Other: No RIGHT upper quadrant free fluid.
IMPRESSION: Normal exam.

## 2021-11-12 ENCOUNTER — Ambulatory Visit: Payer: BC Managed Care – PPO | Admitting: Family Medicine

## 2021-11-12 ENCOUNTER — Encounter: Payer: Self-pay | Admitting: Family Medicine

## 2021-11-12 VITALS — BP 110/61 | HR 73 | Wt 117.4 lb

## 2021-11-12 DIAGNOSIS — Z0000 Encounter for general adult medical examination without abnormal findings: Secondary | ICD-10-CM

## 2021-11-12 DIAGNOSIS — Z13220 Encounter for screening for lipoid disorders: Secondary | ICD-10-CM | POA: Diagnosis not present

## 2021-11-12 DIAGNOSIS — K219 Gastro-esophageal reflux disease without esophagitis: Secondary | ICD-10-CM | POA: Diagnosis not present

## 2021-11-12 DIAGNOSIS — A530 Latent syphilis, unspecified as early or late: Secondary | ICD-10-CM | POA: Diagnosis not present

## 2021-11-12 MED ORDER — DICLOFENAC SODIUM 1 % EX GEL: 2.0000 g | Freq: Four times a day (QID) | CUTANEOUS | 1 refills | Status: DC | PRN

## 2021-11-12 NOTE — Patient Instructions (Signed)
It was great to see you again today!  Checking labs Sent in voltaren gel for your shoulder/neck  Be well, Dr. Ardelia Mems  Ch?m [place] phng ng?a 40-64 tu?i, N? gi?i Preventive Care 47-47 Years Old, Female Ch?m [place] phng ng?a ?? c?p ??n cc l?a ch?n l?i s?ng v cc l?n khm v?i chuyn gia ch?m [place] s?c kh?e c th? t?ng c??ng s?c kh?e v h?nh phc. Cc l?n khm ch?m [place] phng ng?a cn ???c g?i l khm s?c kh?e. Ti c th? d? ki?n nh?ng g ??i v?i l?n khm ch?m [place] phng ng?a c?a ti? T? v?n Chuyn gia ch?m [place] s?c kh?e c th? h?i qu v? v?: B?nh s?, bao g?m: Cc v?n ?? b?nh l tr??c ?y. B?nh s? gia ?nh. Ti?n s? mang thai. S?c kh?e hi?n t?i, bao g?m: Chu k? kinh nguy?t. Ph??ng php ng?a [place]. Tnh tr?ng s?c kh?e tinh th?n. Cu?c s?ng ? nh v tnh tr?ng h?nh phc trong m?i quan h?. Quan h? tnh d?c v s?c kh?e tnh d?c. L?i s?ng, bao g?m: S? d?ng r??u, nicotine ho?c thu?c l v ma ty. Ti?p c?n v?i v? kh. Cc [place] quen ?n, t?p luy?n v ng?. Cng vi?c v mi tr??ng lm vi?c. S? d?ng kem ch?ng n?ng. Cc v?n ?? an ton nh? th?t dy an ton v ??i m? b?o hi?m xe ??p. Khm th?c th? Chuyn gia ch?m [place] s?c kh?e s? ki?m tra qu v? v?: Chi?u cao v cn n?ng. Nh?ng thng s? ny c th? ???c s? d?ng ?? tnh ton BMI (ch? s? kh?i c? th?) c?a qu v?. BMI l m?t s? ?o cho bi?t qu v? c cn n?ng t?t cho s?c kh?e hay khng. Vng eo. ?y l ?o vng eo c?a qu v?. Php ?o ny c?ng cho bi?t li?u qu v? c cn n?ng t?t cho s?c kh?e khng v c th? gip d? ?on nguy c? m?c m?t s? b?nh, ch?ng h?n nh? ti?u ???ng tup 2 v huy?t p cao. Nh?p tim v huy?t p. Thn nhi?t. Da xem c cc ??m b?t th??ng hay khng. Ti c?n ch?ng ng?a nh?ng g?  V?c xin th??ng ???c tim ? cc ?? tu?i khc nhau, theo l?ch. Chuyn gia ch?m [place] s?c kh?e s? khuy?n ngh? cc lo?i v?c xin dnh cho qu v? d?a trn tu?i, b?nh s? v l?i s?ng ho?c cc y?u t? khc, ch?ng h?n nh? ?i l?i ho?c n?i qu v? lm vi?c. Ti c?n lm cc xt nghi?m  no? Sng l?c Chu[place] gia ch?m  s?c kh?e c th? khuy?n ngh? cc ki?m tra sng l?c cho m?t s? tnh tr?ng nh?t ??nh. Vi?c ny c th? bao g?m: N?ng ?? lipid v cholesterol. Sng l?c ti?u ???ng. Vi?c ny ???c th?c hi?n b?ng cch ki?m tra ???ng huy?t (glucose) sau khi qu v? khng ?n g trong m?t kho?ng th?i gian (nh?n ?i). Khm vng ch?u v xt nghi?m ph?t t? bo c? t? cung (Pap). Xt nghi?m vim gan B. Xt nghi?m vim gan C. Xt nghi?m HIV (vi rt gy suy gi?m mi?n d?ch ? ng??i). Xt nghi?m STI (b?nh nhi?m trng ly truy?n qua ???ng tnh d?c), n?u qu v? c nguy c?. Sng l?c ung th? ph?i. Sng l?c ung th? ??i tr?c trng. Ch?p X quang tuy?n v. Hy trao ??i v?i chuyn gia ch?m  s?c kh?e v? vi?c khi no qu v? nn b?t ??u ch?p X quang tuy?n v th??ng xuyn. ?i?u ny c th? ph? thu?c vo vi?c qu v? c ti?n s?  gia ?nh b? ung th? v hay khng. Sng l?c ung th? lin quan ??n BRCA. Xt nghi?m ny c th? ???c th?c hi?n n?u qu v? c ti?n s? gia ?nh b? ung th? v, ung th? bu?ng tr?ng, ung th? ?ng d?n tr?ng ho?c ung th? phc m?c. Ch?p m?t ?? x??ng. Ki?m tra ny ???c th?c hi?n ?? sng l?c b?nh long x??ng. Trao ??i v?i chuyn gia ch?m [place] s?c kh?e cu?a qu v? v? cc k?t qu? xt nghi?m, cc l?a ch?n ?i?u tr? v n?u c?n, nhu c?u lm thm cc ki?m tra. Tun th? nh?ng h??ng d?n ny ? nh: ?n v u?ng  ?n ch? ?? ?n bao g?m tri cy v rau c? t??i, ng? c?c nguyn h?t, protein t? th?t n?c v cc s?n ph?m t? s?a t ch?t bo. Dng th?c ph?m ch?c n?ng c vitamin v ch?t khong theo khuy?n ngh? c?a chuyn gia ch?m [place] s?c kh?e. Khng u?ng r??u n?u: Chuyn gia ch?m [place] s?c kh?e khuyn qu v? khng u?ng r??u. Qu v? c [place], c th? c [place], ho?c ?ang c k? ho?ch c [place]. N?u qu v? u?ng r??u: Gi?i h?n l??ng r??u qu v? u?ng ? 0-1 ly m?i ngy. Bi?t m?t ly c bao nhiu r??u. ? M?, m?t ly t??ng ???ng v?i m?t chai bia 12 ao x? (355 mL), m?t ly r??u vang 5 ao x? (148 mL), ho?c m?t ly r??u m?nh 1 ao x? (44  mL). L?i s?ng ?nh r?ng m?i sng v t?i b?ng kem ?nh r?ng c flo. Dng ch? nha khoa lm s?ch k? r?ng m?i ngy m?t l?n. T?p th? d?c t nh?t 30 pht, t? 5 ngy tr? ln m?i tu?n. Khng s? d?ng b?t k? s?n ph?m no c nicotine ho?c thu?c l. Nh?ng s?n ph?m ny bao g?m thu?c l d?ng ht, thu?c l d?ng nhai v d?ng c? ht thu?c, ch?ng h?n nh? thu?c l ?i?n t?. N?u qu v? c?n gip ?? ?? cai thu?c, hy h?i chuyn gia ch?m [place] s?c kh?e. Khng s? d?ng ma ty. N?u qu v? c quan h? tnh d?c, hy th?c hnh quan h? tnh d?c an ton. S? d?ng bao cao su ho?c hnh th?c b?o v? khc ?? phng ng?a STI. N?u qu v? khng mu?n c [place], hy s? d?ng m?t bi?n php trnh [place]. N?u qu v? d? ??nh c [place], hy g?p chuyn gia ch?m [place] s?c kh?e ?? khm tr??c khi mang [place]. Ch? s? d?ng aspirin theo ch? d?n c?a chuyn gia ch?m [place] s?c kh?e cu?a quy? vi?. Hy ch?c ch?n r?ng qu v? hi?u r c?n dng bao nhiu v dng d?ng no. Lm vi?c v?i chuyn gia ch?m [place] s?c kh?e ?? tm hi?u xem vi?c dng aspirin hng ngy c an ton v c l?i cho qu v? hay khng. Tm cc cch lnh m?nh ?? qu?n l c?ng th?ng, ch?ng h?n nh?: [place]?n, yoga, ho?c nghe nh?c. Vi?t nh?t k. Ni chuy?n v?i m?t ng??i ?ng tin c?y. Dnh th?i gian cho b?n b v gia ?nh. Gi?m [place]?u ti?p xc v?i b?c x? UV ?? gi?m nguy c? ung th? da. An ton Lun th?t dy an ton trong khi li xe ho?c ng?i trn ph??ng ti?n ?i l?i. Khng li xe: N?u qu v? ? u?ng r??u. Khng ?i xe cng v?i m?t ng??i v?a u?ng r??u. Khi qu v? m?t m?i ho?c r?i tr. Trong khi nh?n tin. N?u qu v? ? s? d?ng b?t k? ch?t lm thay ??i tm tr ho?c ma ty. ??  i m? b?o hi?m v cc ?? b?o v? khc trong khi tham gia ho?t ??ng th? thao. N?u qu v? c v? kh trong nh, hy ??m b?o qu v? lm theo t?t c? cc quy trnh an ton v?i sng. Tm ki?m s? tr? gip n?u qu v? ? b? l?m d?ng th? xc ho?c l?m d?ng tnh d?c. C?n lm g ti?p theo? ??n khm v?i chuyn gia ch?m [place] s?c kh?e m?i n?m m?t l?n ?? ki?m tra s?c kh?e  th??ng nin. Hy h?i chuyn gia ch?m [place] s?c kh?e v? vi?c qu v? nn khm m?t v r?ng bao lu m?t l?n. Tim v?c xin ??y ??Tera Mater tin ny khng nh?m m?c ?ch thay th? cho l?i khuyn m chuyn gia ch?m [place] s?c kh?e ni v?i qu v?. Hy b?o ??m qu v? ph?i th?o lu?n b?t k? v?n ?? g m qu v? c v?i chuyn gia ch?m [place] s?c kh?e c?a qu v?. Document Revised: 10/18/2020 Document Reviewed: 10/18/2020 Elsevier Patient Education  [place].

## 2021-11-12 NOTE — Assessment & Plan Note (Signed)
Update RPR today

## 2021-11-12 NOTE — Assessment & Plan Note (Signed)
Well controlled. Continue current medication regimen.  

## 2021-11-12 NOTE — Progress Notes (Signed)
  Date of Visit: 11/12/2021   SUBJECTIVE:   HPI:  Diamond Ramirez presents today for a well woman exam, accompanied by her husband who is also being seen. [place] interpreter utilized during this visit.   Concerns today: neck/shoulder pain, see below Periods: LMP 2 months ago, says is perimenopausal Contraception: taking OCP rx'd by GYN STD Screening: declines STI screening today Pap smear status: UTD, sees GYN Exercise: walks Diet: generally eats healthy Smoking: no Alcohol: no Drugs: no Mood: no concerns Dentist: yes Cancers in family: no  Neck/shoulder pain - present for several years, tried tylenol without relief. Comes and goes. Feels discomfort in muscle. Would like medication to try.  GERD - taking pepcid 20mg  twice daily and omeprazole 40mg  daily. Symptoms well controlled on these medications.  OBJECTIVE:   BP 110/61   Pulse 73   Wt 117 lb 6.4 oz (53.3 kg)   LMP  (LMP Unknown)   SpO2 100%   BMI 20.15 kg/m  Gen: NAD, pleasant, cooperative HEENT: NCAT, PERRL, no palpable thyromegaly or anterior cervical lymphadenopathy Heart: RRR, no murmurs Lungs: CTAB, NWOB Abdomen: soft, nontender to palpation Neuro: grossly nonfocal, speech normal  ASSESSMENT/PLAN:   Health maintenance:  -STD screening: declines today -pap smear: UTD via GYN -mammogram: UTD -lipid screening: update lipids today -colon cancer screening: UTD -immunizations:  Flu: advised follow up for flu shot this fall Tdap: UTD COVID: UTD -handout given on health maintenance topics  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 Continue current medication regimen.   Neck pain Appears musculoskeletal, likely trapezius strain Trial of voltaren gel Advised could pursue xray and physical therapy if patient would like - she will let me know if she wants this  FOLLOW UP: Follow up in 1 year for CPE  J. , MD [facility]

## 2021-11-14 ENCOUNTER — Telehealth: Payer: Self-pay | Admitting: Family Medicine

## 2021-11-14 DIAGNOSIS — R7401 Elevation of levels of liver transaminase levels: Secondary | ICD-10-CM

## 2021-11-14 LAB — RPR: RPR Ser Ql: REACTIVE — AB

## 2021-11-14 LAB — CMP14+EGFR
ALT: 37 IU/L — ABNORMAL HIGH (ref 0–32)
AST: 24 IU/L (ref 0–40)
Albumin/Globulin Ratio: 1.8 (ref 1.2–2.2)
Albumin: 4.6 g/dL (ref 3.9–4.9)
Alkaline Phosphatase: 66 IU/L (ref 44–121)
BUN/Creatinine Ratio: 17 (ref 9–23)
BUN: 12 mg/dL (ref 6–24)
Bilirubin Total: 0.4 mg/dL (ref 0.0–1.2)
CO2: 25 mmol/L (ref 20–29)
Calcium: 9.6 mg/dL (ref 8.7–10.2)
Chloride: 102 mmol/L (ref 96–106)
Creatinine, Ser: 0.69 mg/dL (ref 0.57–1.00)
Globulin, Total: 2.5 g/dL (ref 1.5–4.5)
Glucose: 87 mg/dL (ref 70–99)
Potassium: 4.1 mmol/L (ref 3.5–5.2)
Sodium: 140 mmol/L (ref 134–144)
Total Protein: 7.1 g/dL (ref 6.0–8.5)
eGFR: 108 mL/min/{1.73_m2} (ref >59)

## 2021-11-14 LAB — LIPID PANEL
Chol/HDL Ratio: 4.8 ratio — ABNORMAL HIGH (ref 0.0–4.4)
Cholesterol, Total: 238 mg/dL — ABNORMAL HIGH (ref 100–199)
HDL: 50 mg/dL (ref >39)
LDL Chol Calc (NIH): 157 mg/dL — ABNORMAL HIGH (ref 0–99)
Triglycerides: 173 mg/dL — ABNORMAL HIGH (ref 0–149)
VLDL Cholesterol Cal: 31 mg/dL (ref 5–40)

## 2021-11-14 LAB — RPR, QUANT+TP ABS (REFLEX)
Rapid Plasma Reagin, Quant: 1:2 {titer} — ABNORMAL HIGH
T Pallidum Abs: REACTIVE — AB

## 2021-11-14 NOTE — Telephone Encounter (Signed)
Attempted to reach patient by phone to discuss labs, using [place] interpreter. No answer. LVM asking them to call back.  Sujay Grundman J Keyleen Cerrato, MD   

## 2021-11-15 NOTE — Telephone Encounter (Signed)
Spoke with patient Advised of elevated lipids, not high enough to warrant statin Recommend healthy eating & staying active RPR titer stable, recheck in 1 yr Also noted very mildly elevated transaminase Recommend recheck CMET in 3 months, ordered, advised to schedule lab visit then Patient appreciative & agreeable  Diamond Dodrill, MD

## 2021-11-15 NOTE — Telephone Encounter (Signed)
Patient and daughter return call to nurse line to discuss results.   Advised daughter to keep phone on her for when provider is able to return call.   Veronda Prude, RN

## 2021-11-19 ENCOUNTER — Other Ambulatory Visit: Payer: Self-pay | Admitting: Obstetrics and Gynecology

## 2021-11-23 ENCOUNTER — Other Ambulatory Visit: Payer: Self-pay | Admitting: Obstetrics and Gynecology

## 2021-11-23 DIAGNOSIS — Z1231 Encounter for screening mammogram for malignant neoplasm of breast: Secondary | ICD-10-CM

## 2021-12-21 ENCOUNTER — Ambulatory Visit
Admission: RE | Admit: 2021-12-21 | Discharge: 2021-12-21 | Disposition: A | Discharge status: Home / Self Care | Payer: BC Managed Care – PPO | Source: Ambulatory Visit | Attending: Obstetrics and Gynecology | Admitting: Obstetrics and Gynecology

## 2021-12-21 DIAGNOSIS — Z1231 Encounter for screening mammogram for malignant neoplasm of breast: Secondary | ICD-10-CM | POA: Diagnosis not present

## 2022-03-01 ENCOUNTER — Ambulatory Visit (HOSPITAL_COMMUNITY)
Admission: EM | Admit: 2022-03-01 | Discharge: 2022-03-01 | Disposition: A | Discharge status: Home / Self Care | Payer: BC Managed Care – PPO | Attending: Physician Assistant | Admitting: Physician Assistant

## 2022-03-01 ENCOUNTER — Encounter (HOSPITAL_COMMUNITY): Payer: Self-pay

## 2022-03-01 DIAGNOSIS — H1031 Unspecified acute conjunctivitis, right eye: Secondary | ICD-10-CM

## 2022-03-01 DIAGNOSIS — H01001 Unspecified blepharitis right upper eyelid: Secondary | ICD-10-CM

## 2022-03-01 MED ORDER — PREDNISONE 20 MG PO TABS: 40.0000 mg | ORAL_TABLET | Freq: Every day | ORAL | 0 refills | Status: AC

## 2022-03-01 MED ORDER — POLYMYXIN B-TRIMETHOPRIM 10000-0.1 UNIT/ML-% OP SOLN: 1.0000 [drp] | OPHTHALMIC | 0 refills | Status: AC

## 2022-03-01 NOTE — ED Provider Notes (Signed)
[facility]    CSN: [id number] Arrival date & time: 03/01/22  1224      History   Chief Complaint Chief Complaint  Patient presents with   Eye Problem    HPI [facility] Diamond Ramirez is a 46 y.o. female.   Patient here today for evaluation of right eye irritation with associated redness and swelling that started 2 days ago.  She states that initially she had itching of her eye and she thinks that by scratching it she inflamed her eyelid which is what caused swelling.  She also notes some mild swelling behind her right ear.  She notes some tenderness to same.  She has not had fever.  She denies any injury to her eye that she is aware of.  She has tried over-the-counter lubricating eyedrops without resolution as well as allergy medication without improvement.  The history is provided by the patient. The history is limited by a language barrier. A language interpreter was used [place] [phone]).  Eye Problem Associated symptoms: itching and redness   Associated symptoms: no discharge, no nausea and no vomiting     Past Medical History:  Diagnosis Date   GERD (gastroesophageal reflux disease)     Patient Active Problem List   Diagnosis Date Noted   GERD (gastroesophageal reflux disease) 11/12/2021   Hypotension 04/27/2019   Tenosynovitis of both wrists 04/26/2019   Generalized abdominal pain 03/11/2019   Dizziness 12/14/2015   Breast mass, right 08/31/2015   Healthcare maintenance 08/31/2015   Positive RPR test 07/27/2015    Past Surgical History:  Procedure Laterality Date   TONSILECTOMY, ADENOIDECTOMY, BILATERAL MYRINGOTOMY AND TUBES      OB History   No obstetric history on file.      Home Medications    Prior to Admission medications   Medication Sig Start Date End Date Taking? Authorizing Provider  predniSONE (DELTASONE) 20 MG tablet Take 2 tablets (40 mg total) by mouth daily with breakfast for 5 days. 03/01/22 03/06/22 Yes Tomi Bamberger, PA-C   trimethoprim-polymyxin b (POLYTRIM) ophthalmic solution Place 1 drop into the right eye every 4 (four) hours for 7 days. 03/01/22 03/08/22 Yes Tomi Bamberger, PA-C  diclofenac Sodium (VOLTAREN) 1 % GEL Apply 2 g topically 4 (four) times daily as needed. 11/12/21   Latrelle Dodrill, MD  Ergocalciferol (VITAMIN D2) 50 MCG (2000 UT) TABS Take by mouth.    [provider]  famotidine (PEPCID) 20 MG tablet TAKE 1 TABLET BY MOUTH TWICE A DAY 02/14/21   Latrelle Dodrill, MD  Ferrous Sulfate (IRON PO) Take by mouth.    [provider]  ibuprofen (ADVIL) 600 MG tablet Take 600 mg by mouth every 8 (eight) hours as needed.    [provider]  Multiple Vitamin (MULTIVITAMIN) tablet Take 1 tablet by mouth daily.    [provider]  omeprazole (PRILOSEC) 40 MG capsule TAKE 1 CAPSULE BY MOUTH EVERY DAY BEFORE MEAL EACH DAY 02/13/21   Rachael Fee, MD  SRONYX 0.1-20 MG-MCG tablet Take 1 tablet by mouth daily. 08/08/20   [provider]    Family History Family History  Problem Relation Age of Onset   Hyperlipidemia Mother    Hypertension Mother    Liver disease Mother        Elevated liver enzymes; jaundice   Diabetes Father    Hyperlipidemia Father    Hypertension Father    Liver disease Brother  Elavated liver enzymes; jaundice   Liver disease Maternal Grandmother        Elevated liver enzymes   Colon cancer Neg Hx    Esophageal cancer Neg Hx    Pancreatic cancer Neg Hx    Stomach cancer Neg Hx    Rectal cancer Neg Hx     Social History Social History   Tobacco Use   Smoking status: Never   Smokeless tobacco: Never  Vaping Use   Vaping Use: Never used  Substance Use Topics   Alcohol use: No   Drug use: No     Allergies   Patient has no known allergies.   Review of Systems Review of Systems  Constitutional:  Negative for chills and fever.  Eyes:  Positive for redness and itching. Negative for pain, discharge and  visual disturbance.  Gastrointestinal:  Negative for abdominal pain, nausea and vomiting.     Physical Exam Triage Vital Signs ED Triage Vitals  Enc Vitals Group     BP 03/01/22 1028 131/82     Pulse Rate 03/01/22 1028 89     Resp 03/01/22 1028 12     Temp 03/01/22 1028 98.3 F (36.8 C)     Temp Source 03/01/22 1028 Oral     SpO2 03/01/22 1028 98 %     Weight --      Height --      Head Circumference --      Peak Flow --      Pain Score 03/01/22 1026 6     Pain Loc --      Pain Edu? --      Excl. in GC? --    No data found.  Updated Vital Signs BP 131/82 (BP Location: Left Arm)   Pulse 89   Temp 98.3 F (36.8 C) (Oral)   Resp 12   SpO2 98%   Visual Acuity Right Eye Distance: 20/25 Left Eye Distance: 20/25 Bilateral Distance: 20/25     Physical Exam Vitals and nursing note reviewed.  Constitutional:      General: She is not in acute distress.    Appearance: Normal appearance. She is not ill-appearing.  HENT:     Head: Normocephalic and atraumatic.  Eyes:     Pupils: Pupils are equal, round, and reactive to light.     Comments: Left conjunctiva normal.  Right conjunctiva significantly injected with erythematous and edematous upper right eyelid.  Minimal swelling noted to lower right eyelid  Cardiovascular:     Rate and Rhythm: Normal rate.  Pulmonary:     Effort: Pulmonary effort is normal.  Neurological:     Mental Status: She is alert.  Psychiatric:        Mood and Affect: Mood normal.        Behavior: Behavior normal.        Thought Content: Thought content normal.      UC Treatments / Results  Labs (all labs ordered are listed, but only abnormal results are displayed) Labs Reviewed - No data to display  EKG   Radiology No results found.  Procedures Procedures (including critical care time)  Medications Ordered in UC Medications - No data to display  Initial Impression / Assessment and Plan / UC Course  I have reviewed the triage  vital signs and the nursing notes.  Pertinent labs & imaging results that were available during my care of the patient were reviewed by me and considered in my medical decision making (see chart  for details).    Will treat with steroid burst given significant inflammation of right eyelid, as well as antibiotic drops to cover conjunctivitis.  Recommended follow-up if no gradual improvement or with any further concerns.  No rash appreciated behind right ear as concerned.  If symptoms worsen or fail to improve recommend follow-up with ophthalmologist versus  Final Clinical Impressions(s) / UC Diagnoses   Final diagnoses:  Acute conjunctivitis of right eye, unspecified acute conjunctivitis type  Blepharitis of right upper eyelid, unspecified type   Discharge Instructions   None    ED Prescriptions     Medication Sig Dispense Auth. Provider   predniSONE (DELTASONE) 20 MG tablet Take 2 tablets (40 mg total) by mouth daily with breakfast for 5 days. 10 tablet Tomi Bamberger, PA-C   trimethoprim-polymyxin b (POLYTRIM) ophthalmic solution Place 1 drop into the right eye every 4 (four) hours for 7 days. 10 mL Tomi Bamberger, PA-C      PDMP not reviewed this encounter.   Tomi Bamberger, PA-C 03/01/22 1116

## 2022-03-01 NOTE — ED Triage Notes (Signed)
Pt is here for right eye irritation with redness and swelling  x 2days causing pain , itchiness burning sensation. Rash behind ears x 1day

## 2022-12-25 ENCOUNTER — Other Ambulatory Visit: Payer: Self-pay | Admitting: Obstetrics and Gynecology

## 2022-12-25 DIAGNOSIS — Z0000 Encounter for general adult medical examination without abnormal findings: Secondary | ICD-10-CM

## 2023-01-07 ENCOUNTER — Encounter: Payer: Self-pay | Admitting: Family Medicine

## 2023-01-07 ENCOUNTER — Ambulatory Visit (INDEPENDENT_AMBULATORY_CARE_PROVIDER_SITE_OTHER): Payer: BC Managed Care – PPO | Admitting: Family Medicine

## 2023-01-07 VITALS — BP 103/66 | HR 70 | Ht 64.0 in | Wt 115.6 lb

## 2023-01-07 DIAGNOSIS — Z13220 Encounter for screening for lipoid disorders: Secondary | ICD-10-CM

## 2023-01-07 DIAGNOSIS — R5383 Other fatigue: Secondary | ICD-10-CM

## 2023-01-07 DIAGNOSIS — K219 Gastro-esophageal reflux disease without esophagitis: Secondary | ICD-10-CM

## 2023-01-07 DIAGNOSIS — Z0000 Encounter for general adult medical examination without abnormal findings: Secondary | ICD-10-CM | POA: Diagnosis not present

## 2023-01-07 DIAGNOSIS — Z23 Encounter for immunization: Secondary | ICD-10-CM | POA: Diagnosis not present

## 2023-01-07 DIAGNOSIS — Z113 Encounter for screening for infections with a predominantly sexual mode of transmission: Secondary | ICD-10-CM

## 2023-01-07 MED ORDER — OMEPRAZOLE 40 MG PO CPDR: 40.0000 mg | DELAYED_RELEASE_CAPSULE | Freq: Every day | ORAL | 3 refills | Status: DC

## 2023-01-07 MED ORDER — FAMOTIDINE 20 MG PO TABS: 20.0000 mg | ORAL_TABLET | Freq: Two times a day (BID) | ORAL | 3 refills | Status: DC

## 2023-01-07 NOTE — Patient Instructions (Signed)
It was great to see you again today.  Checking labwork today Flu shot today  Be well, Dr. Pollie Meyer  Health Maintenance for Postmenopausal Women Menopause is a normal process in which your ability to get pregnant comes to an end. This process happens slowly over many months or years, usually between the ages of 65 and 49. Menopause is complete when you have missed your menstrual period for 12 months. It is important to talk with your health care provider about some of the most common conditions that affect women after menopause (postmenopausal women). These include heart disease, cancer, and bone loss (osteoporosis). Adopting a healthy lifestyle and getting preventive care can help to promote your health and wellness. The actions you take can also lower your chances of developing some of these common conditions. What are the signs and symptoms of menopause? During menopause, you may have the following symptoms: Hot flashes. These can be moderate or severe. Night sweats. Decrease in sex drive. Mood swings. Headaches. Tiredness (fatigue). Irritability. Memory problems. Problems falling asleep or staying asleep. Talk with your health care provider about treatment options for your symptoms. Do I need hormone replacement therapy? Hormone replacement therapy is effective in treating symptoms that are caused by menopause, such as hot flashes and night sweats. Hormone replacement carries certain risks, especially as you become older. If you are thinking about using estrogen or estrogen with progestin, discuss the benefits and risks with your health care provider. How can I reduce my risk for heart disease and stroke? The risk of heart disease, heart attack, and stroke increases as you age. One of the causes may be a change in the body's hormones during menopause. This can affect how your body uses dietary fats, triglycerides, and cholesterol. Heart attack and stroke are medical emergencies. There  are many things that you can do to help prevent heart disease and stroke. Watch your blood pressure High blood pressure causes heart disease and increases the risk of stroke. This is more likely to develop in people who have high blood pressure readings or are overweight. Have your blood pressure checked: Every 3-5 years if you are 61-24 years of age. Every year if you are 76 years old or older. Eat a healthy diet  Eat a diet that includes plenty of vegetables, fruits, low-fat dairy products, and lean protein. Do not eat a lot of foods that are high in solid fats, added sugars, or sodium. Get regular exercise Get regular exercise. This is one of the most important things you can do for your health. Most adults should: Try to exercise for at least 150 minutes each week. The exercise should increase your heart rate and make you sweat (moderate-intensity exercise). Try to do strengthening exercises at least twice each week. Do these in addition to the moderate-intensity exercise. Spend less time sitting. Even light physical activity can be beneficial. Other tips Work with your health care provider to achieve or maintain a healthy weight. Do not use any products that contain nicotine or tobacco. These products include cigarettes, chewing tobacco, and vaping devices, such as e-cigarettes. If you need help quitting, ask your health care provider. Know your numbers. Ask your health care provider to check your cholesterol and your blood sugar (glucose). Continue to have your blood tested as directed by your health care provider. Do I need screening for cancer? Depending on your health history and family history, you may need to have cancer screenings at different stages of your life. This may include  screening for: Breast cancer. Cervical cancer. Lung cancer. Colorectal cancer. What is my risk for osteoporosis? After menopause, you may be at increased risk for osteoporosis. Osteoporosis is a  condition in which bone destruction happens more quickly than new bone creation. To help prevent osteoporosis or the bone fractures that can happen because of osteoporosis, you may take the following actions: If you are 18-53 years old, get at least 1,000 mg of calcium and at least 600 international units (IU) of vitamin D per day. If you are older than age 63 but younger than age 28, get at least 1,200 mg of calcium and at least 600 international units (IU) of vitamin D per day. If you are older than age 10, get at least 1,200 mg of calcium and at least 800 international units (IU) of vitamin D per day. Smoking and drinking excessive alcohol increase the risk of osteoporosis. Eat foods that are rich in calcium and vitamin D, and do weight-bearing exercises several times each week as directed by your health care provider. How does menopause affect my mental health? Depression may occur at any age, but it is more common as you become older. Common symptoms of depression include: Feeling depressed. Changes in sleep patterns. Changes in appetite or eating patterns. Feeling an overall lack of motivation or enjoyment of activities that you previously enjoyed. Frequent crying spells. Talk with your health care provider if you think that you are experiencing any of these symptoms. General instructions See your health care provider for regular wellness exams and vaccines. This may include: Scheduling regular health, dental, and eye exams. Getting and maintaining your vaccines. These include: Influenza vaccine. Get this vaccine each year before the flu season begins. Pneumonia vaccine. Shingles vaccine. Tetanus, diphtheria, and pertussis (Tdap) booster vaccine. Your health care provider may also recommend other immunizations. Tell your health care provider if you have ever been abused or do not feel safe at home. Summary Menopause is a normal process in which your ability to get pregnant comes to an  end. This condition causes hot flashes, night sweats, decreased interest in sex, mood swings, headaches, or lack of sleep. Treatment for this condition may include hormone replacement therapy. Take actions to keep yourself healthy, including exercising regularly, eating a healthy diet, watching your weight, and checking your blood pressure and blood sugar levels. Get screened for cancer and depression. Make sure that you are up to date with all your vaccines. This information is not intended to replace advice given to you by your health care provider. Make sure you discuss any questions you have with your health care provider. Document Revised: 08/07/2020 Document Reviewed: 08/07/2020 Elsevier Patient Education  2024 [employer].

## 2023-01-07 NOTE — Progress Notes (Signed)
  Date of Visit: 01/07/2023   SUBJECTIVE:   HPI:  Diamond Ramirez presents today for a well woman exam.   Concerns today: none Periods: no pds >1 year Contraception: previously was on OCPs, but stopped these, says she is menopausal. Had gone a full year without bleeding. Pelvic symptoms: endorses occasional suprapubic discomfort, no discharge  Sexual activity: one female partner in last year STD Screening: desires full STI testing but will wait until sees GYN Pap smear status: gets at GYN, has appointment for this in the next 2 days Exercise: some exercise at home Diet: tries to eat healthy Smoking: no Alcohol: no Drugs: no Mood: good, no concerns Dentist: goes regularly Cancers in family: none  GERD - requests refill of famotidine 20mg  twice daily. Has not used it in a while but does feel some stomach burning sometimes. Also stopped her omeprazole 40mg  daily but wants to restart this as well.  At end of visit patient mentions she feels tired a lot.  OBJECTIVE:   BP 103/66   Pulse 70   Ht 5\' 4"  (1.626 m)   Wt 115 lb 9.6 oz (52.4 kg)   SpO2 100%   BMI 19.84 kg/m  Gen: NAD, pleasant, cooperative HEENT: NCAT, oropharynx clear and moist, good dentition, no palpable thyromegaly or anterior cervical lymphadenopathy Heart: RRR, no murmurs Lungs: CTAB, NWOB Abdomen: soft, nontender to palpation Neuro: grossly nonfocal, speech normal Extremities: No appreciable lower extremity edema bilaterally   ASSESSMENT/PLAN:   Assessment & Plan Routine adult health maintenance -STD screening: HIV, RPR, HbSAg obtained today, will get swabs at GYN office in next 2 days -pap smear: to be updated in next couple of days at GYN office -mammogram: gets through GYN -lipid screening: update lipids & CMET today -colon cancer screening: UTD -immunizations:  Flu: given today COVID: declines today -handout given on health maintenance topics Other fatigue Mentioned by patient at end of visit. Adding CBC  and TSH to labs to rule out anemia & thyroid dysfunction. Gastroesophageal reflux disease without esophagitis Refills sent in for pepcid & omeprazole at patient's request    FOLLOW UP: Follow up in 1 year for next CPE  [place] J. Pollie Meyer, MD [facility]

## 2023-01-07 NOTE — Assessment & Plan Note (Signed)
Refills sent in for pepcid & omeprazole at patient's request

## 2023-01-07 NOTE — Assessment & Plan Note (Signed)
-  STD screening: HIV, RPR, HbSAg obtained today, will get swabs at GYN office in next 2 days -pap smear: to be updated in next couple of days at GYN office -mammogram: gets through GYN -lipid screening: update lipids & CMET today -colon cancer screening: UTD -immunizations:  Flu: given today COVID: declines today -handout given on health maintenance topics

## 2023-01-09 ENCOUNTER — Ambulatory Visit
Admission: RE | Admit: 2023-01-09 | Discharge: 2023-01-09 | Disposition: A | Discharge status: Home / Self Care | Payer: BC Managed Care – PPO | Source: Ambulatory Visit | Attending: Obstetrics and Gynecology | Admitting: Obstetrics and Gynecology

## 2023-01-09 DIAGNOSIS — Z0000 Encounter for general adult medical examination without abnormal findings: Secondary | ICD-10-CM

## 2023-01-09 DIAGNOSIS — Z1231 Encounter for screening mammogram for malignant neoplasm of breast: Secondary | ICD-10-CM | POA: Diagnosis not present

## 2023-01-09 LAB — CBC
Hematocrit: 39.6 % (ref 34.0–46.6)
Hemoglobin: 13 g/dL (ref 11.1–15.9)
MCH: 30.7 pg (ref 26.6–33.0)
MCHC: 32.8 g/dL (ref 31.5–35.7)
MCV: 93 fL (ref 79–97)
Platelets: 227 10*3/uL (ref 150–450)
RBC: 4.24 x10E6/uL (ref 3.77–5.28)
RDW: 12.3 % (ref 11.7–15.4)
WBC: 5 10*3/uL (ref 3.4–10.8)

## 2023-01-09 LAB — HIV ANTIBODY (ROUTINE TESTING W REFLEX): HIV Screen 4th Generation wRfx: NONREACTIVE

## 2023-01-09 LAB — HEPATITIS B SURFACE ANTIGEN: Hepatitis B Surface Ag: NEGATIVE

## 2023-01-09 LAB — TSH RFX ON ABNORMAL TO FREE T4: TSH: 1.34 u[IU]/mL (ref 0.450–4.500)

## 2023-01-09 LAB — CMP14+EGFR
ALT: 16 [IU]/L (ref 0–32)
AST: 21 [IU]/L (ref 0–40)
Albumin: 4.6 g/dL (ref 3.9–4.9)
Alkaline Phosphatase: 75 [IU]/L (ref 44–121)
BUN/Creatinine Ratio: 12 (ref 9–23)
BUN: 9 mg/dL (ref 6–24)
Bilirubin Total: 0.5 mg/dL (ref 0.0–1.2)
CO2: 23 mmol/L (ref 20–29)
Calcium: 9.5 mg/dL (ref 8.7–10.2)
Chloride: 103 mmol/L (ref 96–106)
Creatinine, Ser: 0.78 mg/dL (ref 0.57–1.00)
Globulin, Total: 2.3 g/dL (ref 1.5–4.5)
Glucose: 90 mg/dL (ref 70–99)
Potassium: 4.1 mmol/L (ref 3.5–5.2)
Sodium: 141 mmol/L (ref 134–144)
Total Protein: 6.9 g/dL (ref 6.0–8.5)
eGFR: 94 mL/min/{1.73_m2} (ref >59)

## 2023-01-09 LAB — LIPID PANEL
Chol/HDL Ratio: 4.6 {ratio} — ABNORMAL HIGH (ref 0.0–4.4)
Cholesterol, Total: 211 mg/dL — ABNORMAL HIGH (ref 100–199)
HDL: 46 mg/dL (ref >39)
LDL Chol Calc (NIH): 131 mg/dL — ABNORMAL HIGH (ref 0–99)
Triglycerides: 192 mg/dL — ABNORMAL HIGH (ref 0–149)
VLDL Cholesterol Cal: 34 mg/dL (ref 5–40)

## 2023-01-09 LAB — RPR, QUANT+TP ABS (REFLEX): Rapid Plasma Reagin, Quant: 1:1 {titer} — ABNORMAL HIGH

## 2023-01-09 LAB — RPR: RPR Ser Ql: REACTIVE — AB

## 2023-03-17 ENCOUNTER — Encounter: Payer: Self-pay | Admitting: Obstetrics and Gynecology

## 2023-03-17 ENCOUNTER — Ambulatory Visit (INDEPENDENT_AMBULATORY_CARE_PROVIDER_SITE_OTHER): Payer: BC Managed Care – PPO | Admitting: Obstetrics and Gynecology

## 2023-03-17 VITALS — BP 108/72 | HR 74 | Ht 62.75 in | Wt 116.0 lb

## 2023-03-17 DIAGNOSIS — N951 Menopausal and female climacteric states: Secondary | ICD-10-CM | POA: Insufficient documentation

## 2023-03-17 DIAGNOSIS — N905 Atrophy of vulva: Secondary | ICD-10-CM | POA: Insufficient documentation

## 2023-03-17 DIAGNOSIS — Z01419 Encounter for gynecological examination (general) (routine) without abnormal findings: Secondary | ICD-10-CM | POA: Insufficient documentation

## 2023-03-17 DIAGNOSIS — A530 Latent syphilis, unspecified as early or late: Secondary | ICD-10-CM | POA: Diagnosis not present

## 2023-03-17 DIAGNOSIS — Z8619 Personal history of other infectious and parasitic diseases: Secondary | ICD-10-CM | POA: Diagnosis not present

## 2023-03-17 DIAGNOSIS — Z113 Encounter for screening for infections with a predominantly sexual mode of transmission: Secondary | ICD-10-CM | POA: Diagnosis not present

## 2023-03-17 MED ORDER — ESTRADIOL 0.1 MG/GM VA CREA: TOPICAL_CREAM | VAGINAL | 1 refills | Status: DC

## 2023-03-17 NOTE — Assessment & Plan Note (Signed)
 Cervical cancer screening performed according to ASCCP guidelines. Encouraged annual mammogram screening Colonoscopy UTD DXA N/A Labs and immunizations with her primary Encouraged safe sexual practices as indicated Encouraged healthy lifestyle practices with diet and exercise For patients under 48yo, I recommend 1000mg  calcium daily and 600IU of vitamin D daily.

## 2023-03-17 NOTE — Assessment & Plan Note (Addendum)
We discussed perimenopausal transition. Given improvement in VMS and lighter, less frequent menses, we discussed expectant management at this time. Also consider RUQ pain that she had while on COC. Reviewed CT and RUQ [place] form 2022, which were normal for liver and gallbladder.

## 2023-03-17 NOTE — Progress Notes (Signed)
47 y.o. G0P0000 female here for annual exam. Married. Care giving for mother who is 90yo. Son and daughter in law also live with her. Due to language barrier, an interpreter was present during the history-taking and subsequent discussion (and for part of the physical exam) with this patient Diamond Ramirez [phone].   Patient's last menstrual period was 03/15/2023 (exact date). Period Duration (Days): 4-5 Period Pattern: Regular Menstrual Flow: Heavy Menstrual Control: Maxi pad Dysmenorrhea: (!) Mild Dysmenorrhea Symptoms: Cramping, Headache, Other (Comment) (dizziness)  Pt c/o symptoms of premenopause, started in 2020. Was given OCP to help with HVB. She took them for 2 years but stopped taking them over a year ago due to worsening "nighttime fever" and RUQ pain. Hot flashes have improved since stopping COC and RUQ pain resolved. She still notices fatigue and feeling ill. This year cycle became less frequent. Currently skipping 3-4 months.  She reports poor sleep quality due to taking care of her mother. She sleep 5-7hr a night but wakes frequently to care for her mother. Exercise is also limited due to this.  Pt states she has vaginal itching at times.   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 Husband negative. Desires STD testing.  Small right breast mass, stable per patient.  Abnormal bleeding: irregular cycles Pelvic discharge or pain: none Breast mass, nipple discharge or skin changes : none Birth control: none Last PAP:     Component Value Date/Time   DIAGPAP  03/10/2019 0950    - Negative for intraepithelial lesion or malignancy (NILM)   HPVHIGH Negative 03/10/2019 0950   ADEQPAP  03/10/2019 0950    Satisfactory for evaluation; transformation zone component PRESENT.   Last mammogram: 01/09/23 BIRADS 1, density c Last colonoscopy: 09/01/2020  Sexually active: yes, insertional dyspareunia, burning Exercising: Yes. Walk, yoga, 3-4x per week for ~15 min Smoker: no   GYN  HISTORY: Hx of RPR, treated in [place]  OB History  Gravida Para Term Preterm AB Living  0 0 0 0 0 0  SAB IAB Ectopic Multiple Live Births  0 0 0 0 0    Past Medical History:  Diagnosis Date   GERD (gastroesophageal reflux disease)     Past Surgical History:  Procedure Laterality Date   TONSILECTOMY, ADENOIDECTOMY, BILATERAL MYRINGOTOMY AND TUBES      Current Outpatient Medications on File Prior to Visit  Medication Sig Dispense Refill   Ergocalciferol (VITAMIN D2) 50 MCG (2000 UT) TABS Take by mouth.     famotidine (PEPCID) 20 MG tablet Take 1 tablet (20 mg total) by mouth 2 (two) times daily. 180 tablet 3   Ferrous Sulfate (IRON PO) Take by mouth.     Multiple Vitamin (MULTIVITAMIN) tablet Take 1 tablet by mouth daily.     omeprazole (PRILOSEC) 40 MG capsule Take 1 capsule (40 mg total) by mouth daily. 90 capsule 3   cycloSPORINE (RESTASIS) 0.05 % ophthalmic emulsion Restasis 0.05 % eye drops in a dropperette     No current facility-administered medications on file prior to visit.    Social History   Socioeconomic History   Marital status: Married    Spouse name: Not on file   Number of children: Not on file   Years of education: Not on file   Highest education level: Not on file  Occupational History   Not on file  Tobacco Use   Smoking status: Never   Smokeless tobacco: Never  Vaping Use   Vaping status: Never Used  Substance and Sexual Activity   Alcohol use: No   Drug use: No   Sexual activity: Yes  Other Topics Concern   Not on file  Social History Narrative   Primary Language: Vietnamese   Employment: [employer]   EducationManufacturing engineer   Married -    [phone] (vaginal deliveries, miscarriage was spontaneous)   Social Drivers of Corporate investment banker Strain: Not on file  Food Insecurity: Not on file  Transportation Needs: Not on file  Physical Activity: Not on file  Stress: Not on file  Social Connections: Not on file  Intimate  Partner Violence: Not on file    Family History  Problem Relation Age of Onset   Hyperlipidemia Mother    Hypertension Mother    Liver disease Mother        Elevated liver enzymes; jaundice   Diabetes Father    Hyperlipidemia Father    Hypertension Father    Liver disease Brother        Elavated liver enzymes; jaundice   Liver disease Maternal Grandmother        Elevated liver enzymes   Colon cancer Neg Hx    Esophageal cancer Neg Hx    Pancreatic cancer Neg Hx    Stomach cancer Neg Hx    Rectal cancer Neg Hx     No Known Allergies    PE Today's Vitals   03/17/23 0900  BP: 108/72  Pulse: 74  SpO2: 99%  Weight: 116 lb (52.6 kg)  Height: 5' 2.75" (1.594 m)   Body mass index is 20.71 kg/m.  Physical Exam Vitals reviewed. Exam conducted with a chaperone present.  Constitutional:      General: She is not in acute distress.    Appearance: Normal appearance.  HENT:     Head: Normocephalic and atraumatic.     Nose: Nose normal.  Eyes:     Extraocular Movements: Extraocular movements intact.     Conjunctiva/sclera: Conjunctivae normal.  Neck:     Thyroid: No thyroid mass, thyromegaly or thyroid tenderness.  Pulmonary:     Effort: Pulmonary effort is normal.  Chest:     Chest wall: No mass or tenderness.  Breasts:    Right: Normal. No swelling, mass, nipple discharge, skin change or tenderness.     Left: Normal. No swelling, mass, nipple discharge, skin change or tenderness.       Comments: 1cm small round mobile mass, stable per patient Abdominal:     General: There is no distension.     Palpations: Abdomen is soft.     Tenderness: There is no abdominal tenderness.  Genitourinary:    General: Normal vulva.     Exam position: Lithotomy position.     Urethra: No prolapse.     Vagina: Normal. No vaginal discharge or bleeding.     Cervix: Normal. No lesion.     Uterus: Normal. Not enlarged and not tender.      Adnexa: Right adnexa normal and left adnexa  normal.  Musculoskeletal:        General: Normal range of motion.     Cervical back: Normal range of motion.  Lymphadenopathy:     Upper Body:     Right upper body: No axillary adenopathy.     Left upper body: No axillary adenopathy.     Lower Body: No right inguinal adenopathy. No left inguinal adenopathy.  Skin:    General: Skin is warm and dry.  Neurological:  General: No focal deficit present.     Mental Status: She is alert.  Psychiatric:        Mood and Affect: Mood normal.        Behavior: Behavior normal.       Assessment and Plan:        Well woman exam with routine gynecological exam Assessment & Plan: Cervical cancer screening performed according to ASCCP guidelines. Encouraged annual mammogram screening Colonoscopy UTD DXA N/A Labs and immunizations with her primary Encouraged safe sexual practices as indicated Encouraged healthy lifestyle practices with diet and exercise For patients under 50yo, I recommend 1000mg  calcium daily and 600IU of vitamin D daily.    Screen for STD (sexually transmitted disease) -     Hepatitis B surface antigen -     Hepatitis C antibody -     RPR -     HIV Antibody (routine testing w rflx) -     C. trachomatis/N. gonorrhoeae RNA  Perimenopause Assessment & Plan: We discussed perimenopausal transition. Given improvement in VMS and lighter, less frequent menses, we discussed expectant management at this time. Also consider RUQ pain that she had while on COC. Reviewed CT and RUQ [place] form 2022, which were normal for liver and gallbladder.   Vulvar atrophy Assessment & Plan: Reviewed safety profile of low dose vaginal estrogen, however reviewed that higher doses have been associated with DVT, breast and uterine cancer.  Would benefit given dryness and dyspareunia Patient in agreement  Orders: -     Estradiol; Apply 1/2 gram to vulva nightly for 2 weeks then decrease to 1/2 gram to vulva two nights a week.  Dispense: 42.5 g;  Refill: 1    Rosalyn Gess, MD

## 2023-03-17 NOTE — Assessment & Plan Note (Signed)
Reviewed safety profile of low dose vaginal estrogen, however reviewed that higher doses have been associated with DVT, breast and uterine cancer.  Would benefit given dryness and dyspareunia Patient in agreement

## 2023-03-17 NOTE — Patient Instructions (Signed)

## 2023-03-18 LAB — C. TRACHOMATIS/N. GONORRHOEAE RNA
C. trachomatis RNA, TMA: NOT DETECTED
N. gonorrhoeae RNA, TMA: NOT DETECTED

## 2023-03-20 LAB — T PALLIDUM AB: T Pallidum Abs: POSITIVE — AB

## 2023-03-20 LAB — HIV ANTIBODY (ROUTINE TESTING W REFLEX): HIV 1&2 Ab, 4th Generation: NONREACTIVE

## 2023-03-20 LAB — RPR: RPR Ser Ql: REACTIVE — AB

## 2023-03-20 LAB — RPR TITER: RPR Titer: 1:2 {titer} — ABNORMAL HIGH

## 2023-03-20 LAB — HEPATITIS B SURFACE ANTIGEN: Hepatitis B Surface Ag: NONREACTIVE

## 2023-03-20 LAB — HEPATITIS C ANTIBODY: Hepatitis C Ab: NONREACTIVE

## 2024-01-06 ENCOUNTER — Other Ambulatory Visit: Payer: Self-pay | Admitting: Obstetrics and Gynecology

## 2024-01-06 DIAGNOSIS — Z1231 Encounter for screening mammogram for malignant neoplasm of breast: Secondary | ICD-10-CM

## 2024-01-16 ENCOUNTER — Ambulatory Visit
Admission: RE | Admit: 2024-01-16 | Discharge: 2024-01-16 | Disposition: A | Discharge status: Home / Self Care | Source: Ambulatory Visit | Attending: Obstetrics and Gynecology | Admitting: Obstetrics and Gynecology

## 2024-01-16 DIAGNOSIS — Z1231 Encounter for screening mammogram for malignant neoplasm of breast: Secondary | ICD-10-CM | POA: Diagnosis not present

## 2024-01-21 ENCOUNTER — Ambulatory Visit: Payer: Self-pay | Admitting: Obstetrics and Gynecology

## 2024-02-02 ENCOUNTER — Ambulatory Visit (INDEPENDENT_AMBULATORY_CARE_PROVIDER_SITE_OTHER): Admitting: Family Medicine

## 2024-02-02 ENCOUNTER — Encounter: Payer: Self-pay | Admitting: Family Medicine

## 2024-02-02 VITALS — BP 98/66 | HR 76 | Wt 116.4 lb

## 2024-02-02 DIAGNOSIS — Z23 Encounter for immunization: Secondary | ICD-10-CM | POA: Diagnosis not present

## 2024-02-02 DIAGNOSIS — K219 Gastro-esophageal reflux disease without esophagitis: Secondary | ICD-10-CM | POA: Diagnosis not present

## 2024-02-02 DIAGNOSIS — Z0000 Encounter for general adult medical examination without abnormal findings: Secondary | ICD-10-CM

## 2024-02-02 DIAGNOSIS — Z113 Encounter for screening for infections with a predominantly sexual mode of transmission: Secondary | ICD-10-CM

## 2024-02-02 DIAGNOSIS — A530 Latent syphilis, unspecified as early or late: Secondary | ICD-10-CM

## 2024-02-02 DIAGNOSIS — Z13220 Encounter for screening for lipoid disorders: Secondary | ICD-10-CM

## 2024-02-02 DIAGNOSIS — Z0184 Encounter for antibody response examination: Secondary | ICD-10-CM

## 2024-02-02 MED ORDER — OMEPRAZOLE 40 MG PO CPDR: 40.0000 mg | DELAYED_RELEASE_CAPSULE | Freq: Every day | ORAL | 3 refills | Status: AC

## 2024-02-02 MED ORDER — FAMOTIDINE 20 MG PO TABS: 20.0000 mg | ORAL_TABLET | Freq: Two times a day (BID) | ORAL | 3 refills | Status: AC

## 2024-02-02 NOTE — Patient Instructions (Signed)
 It was great to see you again today.  Checking labs today Refilled your medications Follow up with me in 1 year  Be well, Dr. Donah

## 2024-02-02 NOTE — Progress Notes (Unsigned)
  Date of Visit: 02/02/2024   SUBJECTIVE:   HPI:  Diamond Ramirez presents today for a well woman exam. Vietnamese interpreter utilized during this visit.   Concerns today: none Periods: one period every 3-4 months, last year I documented she had not had periods in >1 year but she now says she has never gone a full year without bleeding. Denies intermenstrual bleeding Contraception:  STD Screening: plans to get swabs at upcoming GYN appointment  Pap smear status: has upcoming GYN appointment to update Exercise: some, not much Diet: *** Smoking: no Alcohol: no Drugs: no Mood: overall good Dentist: goes routinely Cancers in family: none  OBJECTIVE:   BP 98/66   Pulse 76   Wt 116 lb 6.4 oz (52.8 kg)   SpO2 100%   BMI 20.78 kg/m  Gen: NAD, pleasant, cooperative HEENT: NCAT, PERRL, no palpable thyromegaly or anterior cervical lymphadenopathy Heart: RRR, no murmurs Lungs: CTAB, NWOB Abdomen: soft, nontender to palpation Neuro: grossly nonfocal, speech normal  ASSESSMENT/PLAN:   Health maintenance:  -STD screening: update RPR today to monitor titers, rest of swabs to be obtained with GYN appointment  -pap smear: almost due, has GYN appointment scheduled -mammogram: UTD -lipid screening: update today -colon cancer screening: due June 2032 -immunizations:  Flu: given today Tdap: UTD  -handout given on health maintenance topics  Assessment & Plan Encounter for immunization  Routine adult health maintenance  Immunity status testing  Routine screening for STI (sexually transmitted infection)  Screening for hyperlipidemia   FOLLOW UP: Follow up in *** for ***  See[place] Hissong J. Donah, MD [facility]

## 2024-02-03 ENCOUNTER — Ambulatory Visit: Payer: Self-pay | Admitting: Family Medicine

## 2024-02-04 LAB — CMP14+EGFR
ALT: 19 IU/L (ref 0–32)
AST: 19 IU/L (ref 0–40)
Albumin: 4.4 g/dL (ref 3.9–4.9)
Alkaline Phosphatase: 69 IU/L (ref 41–116)
BUN/Creatinine Ratio: 20 (ref 9–23)
BUN: 14 mg/dL (ref 6–24)
Bilirubin Total: 0.8 mg/dL (ref 0.0–1.2)
CO2: 25 mmol/L (ref 20–29)
Calcium: 9.1 mg/dL (ref 8.7–10.2)
Chloride: 102 mmol/L (ref 96–106)
Creatinine, Ser: 0.69 mg/dL (ref 0.57–1.00)
Globulin, Total: 2.4 g/dL (ref 1.5–4.5)
Glucose: 83 mg/dL (ref 70–99)
Potassium: 3.9 mmol/L (ref 3.5–5.2)
Sodium: 139 mmol/L (ref 134–144)
Total Protein: 6.8 g/dL (ref 6.0–8.5)
eGFR: 106 mL/min/1.73 (ref >59)

## 2024-02-04 LAB — LIPID PANEL
Chol/HDL Ratio: 4.5 ratio — ABNORMAL HIGH (ref 0.0–4.4)
Cholesterol, Total: 215 mg/dL — ABNORMAL HIGH (ref 100–199)
HDL: 48 mg/dL (ref >39)
LDL Chol Calc (NIH): 150 mg/dL — ABNORMAL HIGH (ref 0–99)
Triglycerides: 97 mg/dL (ref 0–149)
VLDL Cholesterol Cal: 17 mg/dL (ref 5–40)

## 2024-02-04 LAB — RPR, QUANT+TP ABS (REFLEX)
Rapid Plasma Reagin, Quant: 1:2 {titer} — ABNORMAL HIGH
T Pallidum Abs: REACTIVE — AB

## 2024-02-04 LAB — HEPATITIS B SURFACE ANTIBODY, QUANTITATIVE: Hepatitis B Surf Ab Quant: 33.9 m[IU]/mL

## 2024-02-04 LAB — RPR: RPR Ser Ql: REACTIVE — AB

## 2024-02-04 NOTE — Assessment & Plan Note (Signed)
 GERD managed with famotidine . She ran out of medication and experienced occasional pain. Previously on omeprazole , not taken for 2-3 months. - Refill famotidine  20 mg twice daily. - Refill omeprazole  40 mg daily.

## 2024-02-04 NOTE — Assessment & Plan Note (Signed)
-  STD screening: update RPR today to monitor titers, rest of swabs to be obtained with GYN appointment  -pap smear: almost due, has GYN appointment scheduled -mammogram: UTD -lipid screening: update today -colon cancer screening: due June 2032 -immunizations:  Flu: given today Tdap: UTD Hep B: check titer today

## 2024-03-31 ENCOUNTER — Encounter: Payer: Self-pay | Admitting: Obstetrics and Gynecology

## 2024-03-31 ENCOUNTER — Ambulatory Visit (INDEPENDENT_AMBULATORY_CARE_PROVIDER_SITE_OTHER): Admitting: Obstetrics and Gynecology

## 2024-03-31 ENCOUNTER — Other Ambulatory Visit (HOSPITAL_COMMUNITY)
Admission: RE | Admit: 2024-03-31 | Discharge: 2024-03-31 | Disposition: A | Discharge status: Home / Self Care | Source: Ambulatory Visit | Attending: Obstetrics and Gynecology | Admitting: Obstetrics and Gynecology

## 2024-03-31 VITALS — BP 98/60 | HR 78 | Temp 98.1°F | Ht 63.0 in | Wt 116.0 lb

## 2024-03-31 DIAGNOSIS — Z113 Encounter for screening for infections with a predominantly sexual mode of transmission: Secondary | ICD-10-CM | POA: Diagnosis not present

## 2024-03-31 DIAGNOSIS — N905 Atrophy of vulva: Secondary | ICD-10-CM

## 2024-03-31 DIAGNOSIS — Z01419 Encounter for gynecological examination (general) (routine) without abnormal findings: Secondary | ICD-10-CM

## 2024-03-31 DIAGNOSIS — Z124 Encounter for screening for malignant neoplasm of cervix: Secondary | ICD-10-CM | POA: Diagnosis not present

## 2024-03-31 DIAGNOSIS — N951 Menopausal and female climacteric states: Secondary | ICD-10-CM

## 2024-03-31 DIAGNOSIS — Z1331 Encounter for screening for depression: Secondary | ICD-10-CM

## 2024-03-31 MED ORDER — ESTRADIOL 0.01 % VA CREA: TOPICAL_CREAM | VAGINAL | 1 refills | Status: AC

## 2024-03-31 NOTE — Assessment & Plan Note (Signed)
 We discussed perimenopausal transition. Given improvement in VMS and lighter, less frequent menses, we discussed expectant management at this time.  Also consider RUQ pain that she had while on COC.  Reviewed CT and RUQ US  form 2022, which were normal for liver and gallbladder.  Discussed nonhormonal options, including gabapentin, veozah, paxil. Patient to consider and will schedule f/u to discuss if needed.

## 2024-03-31 NOTE — Patient Instructions (Signed)

## 2024-03-31 NOTE — Assessment & Plan Note (Signed)
 Cervical cancer screening performed according to ASCCP guidelines. Encouraged annual mammogram screening Colonoscopy UTD DXA N/A Labs and immunizations with her primary Encouraged safe sexual practices as indicated Encouraged healthy lifestyle practices with diet and exercise For patients under 50yo, I recommend 1000mg  calcium daily and 600IU of vitamin D daily.

## 2024-03-31 NOTE — Progress Notes (Signed)
 "  48 y.o. G0P0000 female here for annual exam. Married. 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 Son and daughter in law also live with her. Due to language barrier, an interpreter was present during the history-taking and subsequent discussion (and for part of the physical exam) with this patient Diamond Ramirez [phone]. PCP: Donah Laymon PARAS, MD  Patient's last menstrual period was 10/31/2023 (approximate).   She reports poor sleep quality due to taking care of her mother. She sleep 5-7hr a night but wakes frequently to care for her mother. Exercise is also limited due to this. Mother has lived with her for 91yr and is immobile at this time. Her father lives with her brother in [place].  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 Husband negative. Desires annual STD testing. Felt hot flashes were worse with COC, does not want to restart.  She reports vaginal dryness. Only using estrace  sometimes. Fatigue and tired all the time. Taking perimenopause supp that help with hot flashes.  Abnormal bleeding: irregular cycles Pelvic discharge or pain: none Breast mass, nipple discharge or skin changes : none Birth control: none  Last PAP:     Component Value Date/Time   DIAGPAP  03/10/2019 0950    - Negative for intraepithelial lesion or malignancy (NILM)   HPVHIGH Negative 03/10/2019 0950   ADEQPAP  03/10/2019 0950    Satisfactory for evaluation; transformation zone component PRESENT.   Last mammogram: 01/16/24 Birads 1, Density C  Last colonoscopy: 09/01/2020   Sexually active: yes Exercising: Yes. Walk, 3x per week for ~15 min Smoker: no   Garment/textile Technologist Visit from 03/31/2024 in [facility]  PHQ-2 Total Score 0   Flowsheet Row Office Visit from 02/02/2024 in [facility] [place]  PHQ-9 Total Score 0   GYN HISTORY: Hx of RPR, treated in [place] Stable right breast mass, since 2018  OB  History  Gravida Para Term Preterm AB Living  0 0 0 0 0 0  SAB IAB Ectopic Multiple Live Births  0 0 0 0 0    Past Medical History:  Diagnosis Date   GERD (gastroesophageal reflux disease)     Past Surgical History:  Procedure Laterality Date   TONSILECTOMY, ADENOIDECTOMY, BILATERAL MYRINGOTOMY AND TUBES      Current Outpatient Medications on File Prior to Visit  Medication Sig Dispense Refill   Ergocalciferol (VITAMIN D2) 50 MCG (2000 UT) TABS Take by mouth.     famotidine  (PEPCID ) 20 MG tablet Take 1 tablet (20 mg total) by mouth 2 (two) times daily. 180 tablet 3   Ferrous Sulfate (IRON PO) Take by mouth.     omeprazole  (PRILOSEC) 40 MG capsule Take 1 capsule (40 mg total) by mouth daily. 90 capsule 3   No current facility-administered medications on file prior to visit.    Social History   Socioeconomic History   Marital status: Married    Spouse name: Not on file   Number of children: Not on file   Years of education: Not on file   Highest education level: Not on file  Occupational History   Not on file  Tobacco Use   Smoking status: Never   Smokeless tobacco: Never  Vaping Use   Vaping status: Never Used  Substance and Sexual Activity   Alcohol use: No   Drug use: No   Sexual activity: Yes  Other  Topics Concern   Not on file  Social History Narrative   Primary Language: Vietnamese   Employment: [employer]   Education: [employer]   Married -    [phone] (vaginal deliveries, miscarriage was spontaneous)   Social Drivers of Health   Tobacco Use: Low Risk (03/31/2024)   Patient History    Smoking Tobacco Use: Never    Smokeless Tobacco Use: Never    Passive Exposure: Not on file  Financial Resource Strain: Not on file  Food Insecurity: Not on file  Transportation Needs: Not on file  Physical Activity: Not on file  Stress: Not on file  Social Connections: Not on file  Intimate Partner Violence: Not on file  Depression (PHQ2-9): Low Risk  (03/31/2024)   Depression (PHQ2-9)    PHQ-2 Score: 0  Alcohol Screen: Not on file  Housing: Not on file  Utilities: Not on file  Health Literacy: Not on file    Family History  Problem Relation Age of Onset   Hyperlipidemia Mother    Hypertension Mother    Liver disease Mother        Elevated liver enzymes; jaundice   Diabetes Father    Hyperlipidemia Father    Hypertension Father    Liver disease Maternal Grandmother        Elevated liver enzymes   Liver disease Brother        Elavated liver enzymes; jaundice   Colon cancer Neg Hx    Esophageal cancer Neg Hx    Pancreatic cancer Neg Hx    Stomach cancer Neg Hx    Rectal cancer Neg Hx    Breast cancer Neg Hx     No Known Allergies    PE Today's Vitals   03/31/24 1529  BP: 98/60  Pulse: 78  Temp: 98.1 F (36.7 C)  TempSrc: Oral  SpO2: 98%  Weight: 116 lb (52.6 kg)  Height: 5' 3 (1.6 m)   Body mass index is 20.55 kg/m.  Physical Exam Vitals reviewed. Exam conducted with a chaperone present.  Constitutional:      General: She is not in acute distress.    Appearance: Normal appearance.  HENT:     Head: Normocephalic and atraumatic.     Nose: Nose normal.  Eyes:     Extraocular Movements: Extraocular movements intact.     Conjunctiva/sclera: Conjunctivae normal.  Pulmonary:     Effort: Pulmonary effort is normal.  Chest:     Chest wall: No mass or tenderness.  Breasts:    Right: Normal. No swelling, mass, nipple discharge, skin change or tenderness.     Left: Normal. No swelling, mass, nipple discharge, skin change or tenderness.  Abdominal:     General: There is no distension.     Palpations: Abdomen is soft.     Tenderness: There is no abdominal tenderness.  Genitourinary:    General: Normal vulva.     Exam position: Lithotomy position.     Urethra: No prolapse.     Vagina: Normal. No vaginal discharge or bleeding.     Cervix: Normal. No lesion.     Uterus: Normal. Not enlarged and not tender.       Adnexa: Right adnexa normal and left adnexa normal.  Musculoskeletal:        General: Normal range of motion.  Lymphadenopathy:     Upper Body:     Right upper body: No axillary adenopathy.     Left upper body: No axillary adenopathy.  Lower Body: No right inguinal adenopathy. No left inguinal adenopathy.  Skin:    General: Skin is warm and dry.  Neurological:     General: No focal deficit present.     Mental Status: She is alert.  Psychiatric:        Mood and Affect: Mood normal.        Behavior: Behavior normal.      Assessment and Plan:        Well woman exam with routine gynecological exam Assessment & Plan: Cervical cancer screening performed according to ASCCP guidelines. Encouraged annual mammogram screening Colonoscopy UTD DXA N/A Labs and immunizations with her primary Encouraged safe sexual practices as indicated Encouraged healthy lifestyle practices with diet and exercise For patients under 50yo, I recommend 1000mg  calcium daily and 600IU of vitamin D daily.    Cervical cancer screening -     Cytology - PAP  Vulvar atrophy -     Estradiol ; Apply 0.5g to vulva nightly for 2 weeks then 2 times a week. Do not use applicator.  Dispense: 42.5 g; Refill: 1  Screen for STD (sexually transmitted disease) -     Cytology - PAP -     Hepatitis B surface antigen -     Hepatitis C antibody -     RPR W/RFLX TO RPR TITER, TREPONEMAL AB, SCREEN AND DIAGNOSIS -     HIV Antibody (routine testing w rflx)  Perimenopause Assessment & Plan: We discussed perimenopausal transition. Given improvement in VMS and lighter, less frequent menses, we discussed expectant management at this time.  Also consider RUQ pain that she had while on COC.  Reviewed CT and RUQ US  form 2022, which were normal for liver and gallbladder.  Discussed nonhormonal options, including gabapentin, veozah, paxil. Patient to consider and will schedule f/u to discuss if needed.   Negative  depression screening   Vera LULLA Pa, MD  "

## 2024-04-05 LAB — CYTOLOGY - PAP
Chlamydia: NEGATIVE
Comment: NEGATIVE
Comment: NEGATIVE
Comment: NEGATIVE
Comment: NORMAL
Diagnosis: NEGATIVE
High risk HPV: NEGATIVE
Neisseria Gonorrhea: NEGATIVE
Trichomonas: NEGATIVE

## 2024-04-06 LAB — T PALLIDUM AB: T Pallidum Abs: POSITIVE — AB

## 2024-04-06 LAB — SYPHILIS: RPR W/REFLEX TO RPR TITER AND TREPONEMAL ANTIBODIES, TRADITIONAL SCREENING AND DIAGNOSIS ALGORITHM: RPR Ser Ql: REACTIVE — AB

## 2024-04-06 LAB — RPR TITER: RPR Titer: 1:2 {titer} — ABNORMAL HIGH

## 2024-04-06 LAB — HIV ANTIBODY (ROUTINE TESTING W REFLEX)
HIV 1&2 Ab, 4th Generation: NONREACTIVE
HIV FINAL INTERPRETATION: NEGATIVE

## 2024-04-06 LAB — HEPATITIS B SURFACE ANTIGEN: Hepatitis B Surface Ag: NONREACTIVE

## 2024-04-06 LAB — HEPATITIS C ANTIBODY: Hepatitis C Ab: NONREACTIVE

## 2024-04-07 ENCOUNTER — Ambulatory Visit: Payer: Self-pay | Admitting: Obstetrics and Gynecology

## 2025-04-04 ENCOUNTER — Ambulatory Visit: Admitting: Obstetrics and Gynecology
